# Patient Record
Sex: Female | Born: 1990 | Marital: Single | Smoking: Never smoker
Health system: Southern US, Community
[De-identification: ages and names within clinical notes are randomized; demographics above are authoritative.]

## PROBLEM LIST (undated history)

## (undated) DIAGNOSIS — E039 Hypothyroidism, unspecified: Secondary | ICD-10-CM

## (undated) DIAGNOSIS — T783XXA Angioneurotic edema, initial encounter: Secondary | ICD-10-CM

## (undated) HISTORY — DX: Angioneurotic edema, initial encounter: T78.3XXA

## (undated) HISTORY — PX: WISDOM TOOTH EXTRACTION: SHX21

## (undated) HISTORY — PX: TONSILLECTOMY: SUR1361

## (undated) HISTORY — DX: Hypothyroidism, unspecified: E03.9

## (undated) HISTORY — PX: APPENDECTOMY: SHX54

---

## 2015-01-30 DIAGNOSIS — J309 Allergic rhinitis, unspecified: Secondary | ICD-10-CM

## 2015-01-30 DIAGNOSIS — J3089 Other allergic rhinitis: Secondary | ICD-10-CM | POA: Insufficient documentation

## 2015-01-30 DIAGNOSIS — H1010 Acute atopic conjunctivitis, unspecified eye: Secondary | ICD-10-CM

## 2015-03-14 ENCOUNTER — Ambulatory Visit (INDEPENDENT_AMBULATORY_CARE_PROVIDER_SITE_OTHER): Payer: BLUE CROSS/BLUE SHIELD | Admitting: *Deleted

## 2015-03-14 DIAGNOSIS — J309 Allergic rhinitis, unspecified: Secondary | ICD-10-CM | POA: Diagnosis not present

## 2015-03-23 ENCOUNTER — Ambulatory Visit (INDEPENDENT_AMBULATORY_CARE_PROVIDER_SITE_OTHER): Payer: BLUE CROSS/BLUE SHIELD

## 2015-03-23 DIAGNOSIS — J309 Allergic rhinitis, unspecified: Secondary | ICD-10-CM

## 2015-03-28 ENCOUNTER — Ambulatory Visit (INDEPENDENT_AMBULATORY_CARE_PROVIDER_SITE_OTHER): Payer: BLUE CROSS/BLUE SHIELD

## 2015-03-28 DIAGNOSIS — J309 Allergic rhinitis, unspecified: Secondary | ICD-10-CM | POA: Diagnosis not present

## 2015-04-04 ENCOUNTER — Ambulatory Visit (INDEPENDENT_AMBULATORY_CARE_PROVIDER_SITE_OTHER): Payer: BLUE CROSS/BLUE SHIELD

## 2015-04-04 DIAGNOSIS — J309 Allergic rhinitis, unspecified: Secondary | ICD-10-CM | POA: Diagnosis not present

## 2015-04-11 ENCOUNTER — Ambulatory Visit (INDEPENDENT_AMBULATORY_CARE_PROVIDER_SITE_OTHER): Payer: BLUE CROSS/BLUE SHIELD | Admitting: *Deleted

## 2015-04-11 DIAGNOSIS — J309 Allergic rhinitis, unspecified: Secondary | ICD-10-CM | POA: Diagnosis not present

## 2015-04-20 ENCOUNTER — Ambulatory Visit (INDEPENDENT_AMBULATORY_CARE_PROVIDER_SITE_OTHER): Payer: BLUE CROSS/BLUE SHIELD

## 2015-04-20 DIAGNOSIS — J309 Allergic rhinitis, unspecified: Secondary | ICD-10-CM | POA: Diagnosis not present

## 2015-04-25 ENCOUNTER — Ambulatory Visit (INDEPENDENT_AMBULATORY_CARE_PROVIDER_SITE_OTHER): Payer: BLUE CROSS/BLUE SHIELD | Admitting: *Deleted

## 2015-04-25 DIAGNOSIS — J309 Allergic rhinitis, unspecified: Secondary | ICD-10-CM | POA: Diagnosis not present

## 2015-05-02 ENCOUNTER — Ambulatory Visit (INDEPENDENT_AMBULATORY_CARE_PROVIDER_SITE_OTHER): Payer: BLUE CROSS/BLUE SHIELD | Admitting: *Deleted

## 2015-05-02 DIAGNOSIS — J309 Allergic rhinitis, unspecified: Secondary | ICD-10-CM | POA: Diagnosis not present

## 2015-05-09 ENCOUNTER — Ambulatory Visit (INDEPENDENT_AMBULATORY_CARE_PROVIDER_SITE_OTHER): Payer: BLUE CROSS/BLUE SHIELD

## 2015-05-09 DIAGNOSIS — J309 Allergic rhinitis, unspecified: Secondary | ICD-10-CM

## 2015-05-16 ENCOUNTER — Ambulatory Visit (INDEPENDENT_AMBULATORY_CARE_PROVIDER_SITE_OTHER): Payer: BLUE CROSS/BLUE SHIELD

## 2015-05-16 DIAGNOSIS — J309 Allergic rhinitis, unspecified: Secondary | ICD-10-CM | POA: Diagnosis not present

## 2015-05-23 ENCOUNTER — Ambulatory Visit (INDEPENDENT_AMBULATORY_CARE_PROVIDER_SITE_OTHER): Payer: BLUE CROSS/BLUE SHIELD | Admitting: *Deleted

## 2015-05-23 DIAGNOSIS — J309 Allergic rhinitis, unspecified: Secondary | ICD-10-CM

## 2015-05-30 ENCOUNTER — Ambulatory Visit (INDEPENDENT_AMBULATORY_CARE_PROVIDER_SITE_OTHER): Payer: BLUE CROSS/BLUE SHIELD

## 2015-05-30 DIAGNOSIS — J309 Allergic rhinitis, unspecified: Secondary | ICD-10-CM

## 2015-06-08 ENCOUNTER — Ambulatory Visit (INDEPENDENT_AMBULATORY_CARE_PROVIDER_SITE_OTHER): Payer: BLUE CROSS/BLUE SHIELD

## 2015-06-08 DIAGNOSIS — J309 Allergic rhinitis, unspecified: Secondary | ICD-10-CM | POA: Diagnosis not present

## 2015-06-13 ENCOUNTER — Ambulatory Visit (INDEPENDENT_AMBULATORY_CARE_PROVIDER_SITE_OTHER): Payer: BLUE CROSS/BLUE SHIELD

## 2015-06-13 DIAGNOSIS — J309 Allergic rhinitis, unspecified: Secondary | ICD-10-CM | POA: Diagnosis not present

## 2015-06-22 ENCOUNTER — Ambulatory Visit (INDEPENDENT_AMBULATORY_CARE_PROVIDER_SITE_OTHER): Payer: BLUE CROSS/BLUE SHIELD

## 2015-06-22 DIAGNOSIS — J309 Allergic rhinitis, unspecified: Secondary | ICD-10-CM

## 2015-06-29 ENCOUNTER — Ambulatory Visit (INDEPENDENT_AMBULATORY_CARE_PROVIDER_SITE_OTHER): Payer: BLUE CROSS/BLUE SHIELD

## 2015-06-29 DIAGNOSIS — J309 Allergic rhinitis, unspecified: Secondary | ICD-10-CM | POA: Diagnosis not present

## 2015-07-04 ENCOUNTER — Ambulatory Visit (INDEPENDENT_AMBULATORY_CARE_PROVIDER_SITE_OTHER): Payer: BLUE CROSS/BLUE SHIELD | Admitting: *Deleted

## 2015-07-04 DIAGNOSIS — J309 Allergic rhinitis, unspecified: Secondary | ICD-10-CM | POA: Diagnosis not present

## 2015-07-11 ENCOUNTER — Ambulatory Visit (INDEPENDENT_AMBULATORY_CARE_PROVIDER_SITE_OTHER): Payer: BLUE CROSS/BLUE SHIELD

## 2015-07-11 DIAGNOSIS — J309 Allergic rhinitis, unspecified: Secondary | ICD-10-CM

## 2015-07-18 ENCOUNTER — Ambulatory Visit (INDEPENDENT_AMBULATORY_CARE_PROVIDER_SITE_OTHER): Payer: BLUE CROSS/BLUE SHIELD

## 2015-07-18 DIAGNOSIS — J309 Allergic rhinitis, unspecified: Secondary | ICD-10-CM

## 2015-08-01 ENCOUNTER — Ambulatory Visit (INDEPENDENT_AMBULATORY_CARE_PROVIDER_SITE_OTHER): Payer: BLUE CROSS/BLUE SHIELD

## 2015-08-01 DIAGNOSIS — J309 Allergic rhinitis, unspecified: Secondary | ICD-10-CM | POA: Diagnosis not present

## 2015-08-10 ENCOUNTER — Ambulatory Visit (INDEPENDENT_AMBULATORY_CARE_PROVIDER_SITE_OTHER): Payer: BLUE CROSS/BLUE SHIELD

## 2015-08-10 DIAGNOSIS — J309 Allergic rhinitis, unspecified: Secondary | ICD-10-CM

## 2015-08-15 ENCOUNTER — Ambulatory Visit (INDEPENDENT_AMBULATORY_CARE_PROVIDER_SITE_OTHER): Payer: BLUE CROSS/BLUE SHIELD

## 2015-08-15 DIAGNOSIS — J309 Allergic rhinitis, unspecified: Secondary | ICD-10-CM

## 2015-08-24 ENCOUNTER — Ambulatory Visit (INDEPENDENT_AMBULATORY_CARE_PROVIDER_SITE_OTHER): Payer: BLUE CROSS/BLUE SHIELD

## 2015-08-24 DIAGNOSIS — J309 Allergic rhinitis, unspecified: Secondary | ICD-10-CM | POA: Diagnosis not present

## 2015-08-29 ENCOUNTER — Ambulatory Visit (INDEPENDENT_AMBULATORY_CARE_PROVIDER_SITE_OTHER): Payer: BLUE CROSS/BLUE SHIELD

## 2015-08-29 DIAGNOSIS — J309 Allergic rhinitis, unspecified: Secondary | ICD-10-CM

## 2015-09-05 ENCOUNTER — Ambulatory Visit (INDEPENDENT_AMBULATORY_CARE_PROVIDER_SITE_OTHER): Payer: BLUE CROSS/BLUE SHIELD

## 2015-09-05 DIAGNOSIS — J309 Allergic rhinitis, unspecified: Secondary | ICD-10-CM | POA: Diagnosis not present

## 2015-09-08 DIAGNOSIS — J3089 Other allergic rhinitis: Secondary | ICD-10-CM | POA: Diagnosis not present

## 2015-09-12 ENCOUNTER — Ambulatory Visit (INDEPENDENT_AMBULATORY_CARE_PROVIDER_SITE_OTHER): Payer: BLUE CROSS/BLUE SHIELD

## 2015-09-12 DIAGNOSIS — J309 Allergic rhinitis, unspecified: Secondary | ICD-10-CM | POA: Diagnosis not present

## 2015-09-21 ENCOUNTER — Ambulatory Visit (INDEPENDENT_AMBULATORY_CARE_PROVIDER_SITE_OTHER): Payer: BLUE CROSS/BLUE SHIELD

## 2015-09-21 DIAGNOSIS — J309 Allergic rhinitis, unspecified: Secondary | ICD-10-CM

## 2015-09-26 ENCOUNTER — Ambulatory Visit (INDEPENDENT_AMBULATORY_CARE_PROVIDER_SITE_OTHER): Payer: BLUE CROSS/BLUE SHIELD | Admitting: *Deleted

## 2015-09-26 DIAGNOSIS — J309 Allergic rhinitis, unspecified: Secondary | ICD-10-CM

## 2015-10-03 ENCOUNTER — Ambulatory Visit (INDEPENDENT_AMBULATORY_CARE_PROVIDER_SITE_OTHER): Payer: BLUE CROSS/BLUE SHIELD

## 2015-10-03 DIAGNOSIS — J309 Allergic rhinitis, unspecified: Secondary | ICD-10-CM | POA: Diagnosis not present

## 2015-10-10 ENCOUNTER — Ambulatory Visit (INDEPENDENT_AMBULATORY_CARE_PROVIDER_SITE_OTHER): Payer: BLUE CROSS/BLUE SHIELD

## 2015-10-10 DIAGNOSIS — J309 Allergic rhinitis, unspecified: Secondary | ICD-10-CM | POA: Diagnosis not present

## 2015-10-19 ENCOUNTER — Ambulatory Visit (INDEPENDENT_AMBULATORY_CARE_PROVIDER_SITE_OTHER): Payer: BLUE CROSS/BLUE SHIELD

## 2015-10-19 DIAGNOSIS — J309 Allergic rhinitis, unspecified: Secondary | ICD-10-CM | POA: Diagnosis not present

## 2015-10-26 ENCOUNTER — Ambulatory Visit (INDEPENDENT_AMBULATORY_CARE_PROVIDER_SITE_OTHER): Payer: BLUE CROSS/BLUE SHIELD

## 2015-10-26 DIAGNOSIS — J309 Allergic rhinitis, unspecified: Secondary | ICD-10-CM

## 2015-10-31 ENCOUNTER — Ambulatory Visit (INDEPENDENT_AMBULATORY_CARE_PROVIDER_SITE_OTHER): Payer: BLUE CROSS/BLUE SHIELD | Admitting: *Deleted

## 2015-10-31 DIAGNOSIS — J309 Allergic rhinitis, unspecified: Secondary | ICD-10-CM | POA: Diagnosis not present

## 2015-11-07 ENCOUNTER — Ambulatory Visit (INDEPENDENT_AMBULATORY_CARE_PROVIDER_SITE_OTHER): Payer: BLUE CROSS/BLUE SHIELD

## 2015-11-07 DIAGNOSIS — J309 Allergic rhinitis, unspecified: Secondary | ICD-10-CM | POA: Diagnosis not present

## 2015-11-14 ENCOUNTER — Ambulatory Visit (INDEPENDENT_AMBULATORY_CARE_PROVIDER_SITE_OTHER): Payer: BLUE CROSS/BLUE SHIELD

## 2015-11-14 DIAGNOSIS — J309 Allergic rhinitis, unspecified: Secondary | ICD-10-CM

## 2015-11-23 ENCOUNTER — Ambulatory Visit (INDEPENDENT_AMBULATORY_CARE_PROVIDER_SITE_OTHER): Payer: BLUE CROSS/BLUE SHIELD | Admitting: *Deleted

## 2015-11-23 DIAGNOSIS — J309 Allergic rhinitis, unspecified: Secondary | ICD-10-CM

## 2015-11-30 ENCOUNTER — Ambulatory Visit (INDEPENDENT_AMBULATORY_CARE_PROVIDER_SITE_OTHER): Payer: BLUE CROSS/BLUE SHIELD

## 2015-11-30 DIAGNOSIS — J309 Allergic rhinitis, unspecified: Secondary | ICD-10-CM | POA: Diagnosis not present

## 2015-12-07 ENCOUNTER — Ambulatory Visit (INDEPENDENT_AMBULATORY_CARE_PROVIDER_SITE_OTHER): Payer: BLUE CROSS/BLUE SHIELD

## 2015-12-07 DIAGNOSIS — J309 Allergic rhinitis, unspecified: Secondary | ICD-10-CM | POA: Diagnosis not present

## 2015-12-14 ENCOUNTER — Ambulatory Visit (INDEPENDENT_AMBULATORY_CARE_PROVIDER_SITE_OTHER): Payer: BLUE CROSS/BLUE SHIELD

## 2015-12-14 DIAGNOSIS — J309 Allergic rhinitis, unspecified: Secondary | ICD-10-CM | POA: Diagnosis not present

## 2015-12-21 ENCOUNTER — Ambulatory Visit (INDEPENDENT_AMBULATORY_CARE_PROVIDER_SITE_OTHER): Payer: BLUE CROSS/BLUE SHIELD

## 2015-12-21 DIAGNOSIS — J309 Allergic rhinitis, unspecified: Secondary | ICD-10-CM

## 2015-12-26 ENCOUNTER — Ambulatory Visit (INDEPENDENT_AMBULATORY_CARE_PROVIDER_SITE_OTHER): Payer: BLUE CROSS/BLUE SHIELD | Admitting: *Deleted

## 2015-12-26 DIAGNOSIS — J309 Allergic rhinitis, unspecified: Secondary | ICD-10-CM | POA: Diagnosis not present

## 2016-01-02 ENCOUNTER — Ambulatory Visit (INDEPENDENT_AMBULATORY_CARE_PROVIDER_SITE_OTHER): Payer: BLUE CROSS/BLUE SHIELD | Admitting: *Deleted

## 2016-01-02 DIAGNOSIS — J309 Allergic rhinitis, unspecified: Secondary | ICD-10-CM | POA: Diagnosis not present

## 2016-01-09 ENCOUNTER — Ambulatory Visit (INDEPENDENT_AMBULATORY_CARE_PROVIDER_SITE_OTHER): Payer: BLUE CROSS/BLUE SHIELD

## 2016-01-09 DIAGNOSIS — J309 Allergic rhinitis, unspecified: Secondary | ICD-10-CM

## 2016-01-16 ENCOUNTER — Ambulatory Visit (INDEPENDENT_AMBULATORY_CARE_PROVIDER_SITE_OTHER): Payer: BLUE CROSS/BLUE SHIELD | Admitting: *Deleted

## 2016-01-16 DIAGNOSIS — J309 Allergic rhinitis, unspecified: Secondary | ICD-10-CM

## 2016-01-25 ENCOUNTER — Ambulatory Visit (INDEPENDENT_AMBULATORY_CARE_PROVIDER_SITE_OTHER): Payer: BLUE CROSS/BLUE SHIELD

## 2016-01-25 DIAGNOSIS — J309 Allergic rhinitis, unspecified: Secondary | ICD-10-CM

## 2016-01-30 ENCOUNTER — Ambulatory Visit (INDEPENDENT_AMBULATORY_CARE_PROVIDER_SITE_OTHER): Payer: BLUE CROSS/BLUE SHIELD | Admitting: *Deleted

## 2016-01-30 DIAGNOSIS — J309 Allergic rhinitis, unspecified: Secondary | ICD-10-CM

## 2016-01-31 DIAGNOSIS — J3089 Other allergic rhinitis: Secondary | ICD-10-CM | POA: Diagnosis not present

## 2016-02-08 ENCOUNTER — Ambulatory Visit (INDEPENDENT_AMBULATORY_CARE_PROVIDER_SITE_OTHER): Payer: BLUE CROSS/BLUE SHIELD | Admitting: *Deleted

## 2016-02-08 DIAGNOSIS — J309 Allergic rhinitis, unspecified: Secondary | ICD-10-CM

## 2016-02-13 ENCOUNTER — Ambulatory Visit (INDEPENDENT_AMBULATORY_CARE_PROVIDER_SITE_OTHER): Payer: BLUE CROSS/BLUE SHIELD

## 2016-02-13 DIAGNOSIS — J309 Allergic rhinitis, unspecified: Secondary | ICD-10-CM | POA: Diagnosis not present

## 2016-02-22 ENCOUNTER — Ambulatory Visit (INDEPENDENT_AMBULATORY_CARE_PROVIDER_SITE_OTHER): Payer: BLUE CROSS/BLUE SHIELD | Admitting: *Deleted

## 2016-02-22 DIAGNOSIS — J309 Allergic rhinitis, unspecified: Secondary | ICD-10-CM

## 2016-02-27 ENCOUNTER — Ambulatory Visit (INDEPENDENT_AMBULATORY_CARE_PROVIDER_SITE_OTHER): Payer: BLUE CROSS/BLUE SHIELD

## 2016-02-27 DIAGNOSIS — J309 Allergic rhinitis, unspecified: Secondary | ICD-10-CM

## 2016-03-05 ENCOUNTER — Ambulatory Visit (INDEPENDENT_AMBULATORY_CARE_PROVIDER_SITE_OTHER): Payer: BLUE CROSS/BLUE SHIELD

## 2016-03-05 DIAGNOSIS — J309 Allergic rhinitis, unspecified: Secondary | ICD-10-CM

## 2016-03-14 ENCOUNTER — Ambulatory Visit (INDEPENDENT_AMBULATORY_CARE_PROVIDER_SITE_OTHER): Payer: BLUE CROSS/BLUE SHIELD | Admitting: *Deleted

## 2016-03-14 DIAGNOSIS — J309 Allergic rhinitis, unspecified: Secondary | ICD-10-CM | POA: Diagnosis not present

## 2016-03-21 ENCOUNTER — Ambulatory Visit (INDEPENDENT_AMBULATORY_CARE_PROVIDER_SITE_OTHER): Payer: BLUE CROSS/BLUE SHIELD | Admitting: *Deleted

## 2016-03-21 DIAGNOSIS — J309 Allergic rhinitis, unspecified: Secondary | ICD-10-CM

## 2016-03-26 ENCOUNTER — Ambulatory Visit (INDEPENDENT_AMBULATORY_CARE_PROVIDER_SITE_OTHER): Payer: BLUE CROSS/BLUE SHIELD | Admitting: *Deleted

## 2016-03-26 DIAGNOSIS — J309 Allergic rhinitis, unspecified: Secondary | ICD-10-CM | POA: Diagnosis not present

## 2016-04-09 ENCOUNTER — Ambulatory Visit (INDEPENDENT_AMBULATORY_CARE_PROVIDER_SITE_OTHER): Payer: BLUE CROSS/BLUE SHIELD

## 2016-04-09 DIAGNOSIS — J309 Allergic rhinitis, unspecified: Secondary | ICD-10-CM | POA: Diagnosis not present

## 2016-04-16 ENCOUNTER — Ambulatory Visit (INDEPENDENT_AMBULATORY_CARE_PROVIDER_SITE_OTHER): Payer: BLUE CROSS/BLUE SHIELD

## 2016-04-16 DIAGNOSIS — J309 Allergic rhinitis, unspecified: Secondary | ICD-10-CM | POA: Diagnosis not present

## 2016-04-25 ENCOUNTER — Ambulatory Visit (INDEPENDENT_AMBULATORY_CARE_PROVIDER_SITE_OTHER): Payer: BLUE CROSS/BLUE SHIELD | Admitting: *Deleted

## 2016-04-25 DIAGNOSIS — J309 Allergic rhinitis, unspecified: Secondary | ICD-10-CM | POA: Diagnosis not present

## 2016-04-30 ENCOUNTER — Ambulatory Visit (INDEPENDENT_AMBULATORY_CARE_PROVIDER_SITE_OTHER): Payer: BLUE CROSS/BLUE SHIELD | Admitting: *Deleted

## 2016-04-30 DIAGNOSIS — J309 Allergic rhinitis, unspecified: Secondary | ICD-10-CM | POA: Diagnosis not present

## 2016-05-08 DIAGNOSIS — J3089 Other allergic rhinitis: Secondary | ICD-10-CM | POA: Diagnosis not present

## 2016-05-09 ENCOUNTER — Ambulatory Visit (INDEPENDENT_AMBULATORY_CARE_PROVIDER_SITE_OTHER): Payer: BLUE CROSS/BLUE SHIELD

## 2016-05-09 DIAGNOSIS — J309 Allergic rhinitis, unspecified: Secondary | ICD-10-CM

## 2016-05-16 ENCOUNTER — Ambulatory Visit (INDEPENDENT_AMBULATORY_CARE_PROVIDER_SITE_OTHER): Payer: BLUE CROSS/BLUE SHIELD | Admitting: *Deleted

## 2016-05-16 DIAGNOSIS — J309 Allergic rhinitis, unspecified: Secondary | ICD-10-CM | POA: Diagnosis not present

## 2016-05-21 ENCOUNTER — Ambulatory Visit (INDEPENDENT_AMBULATORY_CARE_PROVIDER_SITE_OTHER): Payer: BLUE CROSS/BLUE SHIELD | Admitting: *Deleted

## 2016-05-21 DIAGNOSIS — J309 Allergic rhinitis, unspecified: Secondary | ICD-10-CM | POA: Diagnosis not present

## 2016-05-28 ENCOUNTER — Ambulatory Visit (INDEPENDENT_AMBULATORY_CARE_PROVIDER_SITE_OTHER): Payer: BLUE CROSS/BLUE SHIELD | Admitting: *Deleted

## 2016-05-28 DIAGNOSIS — J309 Allergic rhinitis, unspecified: Secondary | ICD-10-CM | POA: Diagnosis not present

## 2016-06-06 ENCOUNTER — Ambulatory Visit (INDEPENDENT_AMBULATORY_CARE_PROVIDER_SITE_OTHER): Payer: BLUE CROSS/BLUE SHIELD

## 2016-06-06 DIAGNOSIS — J309 Allergic rhinitis, unspecified: Secondary | ICD-10-CM | POA: Diagnosis not present

## 2016-06-20 ENCOUNTER — Ambulatory Visit (INDEPENDENT_AMBULATORY_CARE_PROVIDER_SITE_OTHER): Payer: BLUE CROSS/BLUE SHIELD

## 2016-06-20 DIAGNOSIS — J309 Allergic rhinitis, unspecified: Secondary | ICD-10-CM

## 2016-06-25 ENCOUNTER — Ambulatory Visit (INDEPENDENT_AMBULATORY_CARE_PROVIDER_SITE_OTHER): Payer: BLUE CROSS/BLUE SHIELD | Admitting: *Deleted

## 2016-06-25 DIAGNOSIS — J309 Allergic rhinitis, unspecified: Secondary | ICD-10-CM

## 2016-07-04 ENCOUNTER — Ambulatory Visit (INDEPENDENT_AMBULATORY_CARE_PROVIDER_SITE_OTHER): Payer: BLUE CROSS/BLUE SHIELD | Admitting: *Deleted

## 2016-07-04 DIAGNOSIS — J309 Allergic rhinitis, unspecified: Secondary | ICD-10-CM | POA: Diagnosis not present

## 2016-07-09 ENCOUNTER — Ambulatory Visit (INDEPENDENT_AMBULATORY_CARE_PROVIDER_SITE_OTHER): Payer: BLUE CROSS/BLUE SHIELD

## 2016-07-09 DIAGNOSIS — J309 Allergic rhinitis, unspecified: Secondary | ICD-10-CM

## 2016-07-16 ENCOUNTER — Ambulatory Visit (INDEPENDENT_AMBULATORY_CARE_PROVIDER_SITE_OTHER): Payer: BLUE CROSS/BLUE SHIELD

## 2016-07-16 DIAGNOSIS — J309 Allergic rhinitis, unspecified: Secondary | ICD-10-CM | POA: Diagnosis not present

## 2016-07-19 NOTE — Addendum Note (Signed)
Addended by: Berna BueWHITAKER, CARRIE L on: 07/19/2016 11:52 AM   Modules accepted: Orders

## 2016-07-30 ENCOUNTER — Ambulatory Visit (INDEPENDENT_AMBULATORY_CARE_PROVIDER_SITE_OTHER): Payer: BLUE CROSS/BLUE SHIELD | Admitting: *Deleted

## 2016-07-30 DIAGNOSIS — J309 Allergic rhinitis, unspecified: Secondary | ICD-10-CM

## 2016-08-08 ENCOUNTER — Ambulatory Visit (INDEPENDENT_AMBULATORY_CARE_PROVIDER_SITE_OTHER): Payer: BLUE CROSS/BLUE SHIELD

## 2016-08-08 DIAGNOSIS — J309 Allergic rhinitis, unspecified: Secondary | ICD-10-CM | POA: Diagnosis not present

## 2016-08-13 DIAGNOSIS — J3089 Other allergic rhinitis: Secondary | ICD-10-CM | POA: Diagnosis not present

## 2016-08-15 ENCOUNTER — Encounter (INDEPENDENT_AMBULATORY_CARE_PROVIDER_SITE_OTHER): Payer: Self-pay

## 2016-08-15 ENCOUNTER — Encounter: Payer: Self-pay | Admitting: Allergy & Immunology

## 2016-08-15 ENCOUNTER — Ambulatory Visit: Payer: Self-pay | Admitting: *Deleted

## 2016-08-15 ENCOUNTER — Ambulatory Visit (INDEPENDENT_AMBULATORY_CARE_PROVIDER_SITE_OTHER): Payer: BLUE CROSS/BLUE SHIELD | Admitting: Allergy & Immunology

## 2016-08-15 VITALS — BP 102/72 | HR 78 | Temp 98.0°F | Resp 14 | Ht 61.0 in | Wt 117.0 lb

## 2016-08-15 DIAGNOSIS — J3089 Other allergic rhinitis: Secondary | ICD-10-CM

## 2016-08-15 DIAGNOSIS — J309 Allergic rhinitis, unspecified: Secondary | ICD-10-CM

## 2016-08-15 MED ORDER — EPINEPHRINE 0.3 MG/0.3ML IJ SOAJ: 0.3000 mg | Freq: Once | INTRAMUSCULAR | 1 refills | Status: AC

## 2016-08-15 NOTE — Progress Notes (Signed)
FOLLOW UP  Date of Service/Encounter:  08/15/16   Assessment:   Chronic nonseasonal allergic rhinitis due to other allergen   Plan/Recommendations:   1. Chronic nonseasonal allergic rhinitis - on immunotherapy - We will continue with the injections at the same rate.  - We will advance your dose very slowly given your reaction: drop down to 0.8725mL Stacy remain there for three doses. - Then if tolerated, advance at the Schedule A rate. - Try taking an extra Zyrtec (cetirizine) on the day that you have shots.  - We will send in a prescription for AuviQ.  - Continue with Zyrtec (cetirizine) 10mg  daily. - Look on [employer] for cheaper generics.   2. Return in about 1 year (around 08/15/2017).     Subjective:   Stacy Howard is a 26 y.o. female presenting today for follow up of  Chief Complaint  Patient presents with  . Allergic Rhinitis     needs epipen refilled. medications are working well. did have strange reaction 2 weeks ago, 1 hr after injection. Had a scratchy feeling in throat, then felt like it was harder to breath, Stacy felt like throat was closing.     Stacy Howard has a history of the following: Patient Active Problem List   Diagnosis Date Noted  . Allergic rhinitis 01/30/2015  . Allergic conjunctivitis 01/30/2015    History obtained from: chart review Stacy patient.  Madyn Capistran was referred by Molinda BailiffSMITH, ERNEST, PA.   Stacy Howard is a 26 y.o. female presenting for a follow up visit. She is currently on immunotherapy Stacy receives only one injection with dust mite. She was seen in 2016 for an office visit. She was previously allergic to cats Stacy grass but this was not present on the last testing. She has been on injections for a period of time when she was around [age]. Then she started a few years again.  Since the last visit, she has done well. She feels that her shots are working well. She had a reaction within one hour when she was on her way home. The reaction  did not last long, but the throat issue is what worried her the most. Then she felt rhinorrhea Stacy itchy eyes. These are much better with the injections. She takes cetirizine daily.   Otherwise, there have been no changes to her past medical history, surgical history, family history, or social history. She is otherwise healthy. 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    Review of Systems: a 14-point review of systems is pertinent for what is mentioned in HPI.  Otherwise, all other systems were negative. Constitutional: negative other than that listed in the HPI Eyes: negative other than that listed in the HPI Ears, nose, mouth, throat, Stacy face: negative other than that listed in the HPI Respiratory: negative other than that listed in the HPI Cardiovascular: negative other than that listed in the HPI Gastrointestinal: negative other than that listed in the HPI Genitourinary: negative other than that listed in the HPI Integument: negative other than that listed in the HPI Hematologic: negative other than that listed in the HPI Musculoskeletal: negative other than that listed in the HPI Neurological: negative other than that listed in the HPI Allergy/Immunologic: negative other than that listed in the HPI    Objective:   Blood pressure 102/72, pulse 78, temperature 98 F (36.7 C), temperature source Oral, resp. rate 14, height 5\' 1"  (  1.549 m), weight 117 lb (53.1 kg), SpO2 99 %. Body mass index is 22.11 kg/m.   Physical Exam:  General: Alert, interactive, in no acute distress. Cooperative with the exam. Pleasant.  Eyes: No conjunctival injection present on the right, No conjunctival injection present on the left, PERRL bilaterally, No discharge on the right, No discharge on the left Stacy No Horner-Trantas dots present Ears: Right TM pearly gray with normal light reflex, Left TM pearly gray with normal light reflex,  Right TM intact without perforation Stacy Left TM intact without perforation.  Nose/Throat: External nose within normal limits Stacy septum midline, turbinates edematous with clear discharge, post-pharynx erythematous without cobblestoning in the posterior oropharynx. Tonsils 2+ without exudates Neck: Supple without thyromegaly. Lungs: Clear to auscultation without wheezing, rhonchi or rales. No increased work of breathing. CV: Normal S1/S2, no murmurs. Capillary refill <2 seconds.  Skin: Warm Stacy dry, without lesions or rashes. Neuro:   Grossly intact. No focal deficits appreciated. Responsive to questions.   Diagnostic studies: None    Stacy Bonds, MD [facility]

## 2016-08-15 NOTE — Patient Instructions (Addendum)
1. Chronic nonseasonal allergic rhinitis - on immunotherapy - We will continue with the injections at the same rate.  - We will advance your dose very slowly given your reaction: drop down to 0.4625mL and remain there for three doses. - Then if tolerated, advance at the Schedule A rate. - Try taking an extra Zyrtec (cetirizine) on the day that you have shots.  - We will send in a prescription for AuviQ.  - Continue with Zyrtec (cetirizine) 10mg  daily. - Look on [employer] for cheaper generics.   2. Return in about 1 year (around 08/15/2017).  Please inform us of any Emergency Department visits, hospitalizations, or changes in symptoms. Call us before going to the ED for breathing or allergy symptoms since we might be able to fit you in for a sick visit. Feel free to contact us anytime with any questions, problems, or concerns.  It was a pleasure to meet you today! Happy spring!   Websites that have reliable patient information: 1. American Academy of Asthma, Allergy, and Immunology: www.aaaai.org 2. Food Allergy Research and Education (FARE): foodallergy.org 3. Mothers of Asthmatics: http://www.asthmacommunitynetwork.org 4. American College of Allergy, Asthma, and Immunology: www.acaai.org

## 2016-08-22 ENCOUNTER — Ambulatory Visit (INDEPENDENT_AMBULATORY_CARE_PROVIDER_SITE_OTHER): Payer: BLUE CROSS/BLUE SHIELD | Admitting: *Deleted

## 2016-08-22 DIAGNOSIS — J309 Allergic rhinitis, unspecified: Secondary | ICD-10-CM | POA: Diagnosis not present

## 2016-08-29 ENCOUNTER — Ambulatory Visit (INDEPENDENT_AMBULATORY_CARE_PROVIDER_SITE_OTHER): Payer: BLUE CROSS/BLUE SHIELD | Admitting: *Deleted

## 2016-08-29 DIAGNOSIS — J309 Allergic rhinitis, unspecified: Secondary | ICD-10-CM | POA: Diagnosis not present

## 2016-09-03 ENCOUNTER — Ambulatory Visit (INDEPENDENT_AMBULATORY_CARE_PROVIDER_SITE_OTHER): Payer: BLUE CROSS/BLUE SHIELD | Admitting: *Deleted

## 2016-09-03 DIAGNOSIS — J309 Allergic rhinitis, unspecified: Secondary | ICD-10-CM | POA: Diagnosis not present

## 2016-09-12 ENCOUNTER — Ambulatory Visit (INDEPENDENT_AMBULATORY_CARE_PROVIDER_SITE_OTHER): Payer: BLUE CROSS/BLUE SHIELD | Admitting: *Deleted

## 2016-09-12 DIAGNOSIS — J309 Allergic rhinitis, unspecified: Secondary | ICD-10-CM

## 2016-09-19 ENCOUNTER — Ambulatory Visit (INDEPENDENT_AMBULATORY_CARE_PROVIDER_SITE_OTHER): Payer: BLUE CROSS/BLUE SHIELD | Admitting: *Deleted

## 2016-09-19 DIAGNOSIS — J309 Allergic rhinitis, unspecified: Secondary | ICD-10-CM

## 2016-09-24 ENCOUNTER — Ambulatory Visit (INDEPENDENT_AMBULATORY_CARE_PROVIDER_SITE_OTHER): Payer: BLUE CROSS/BLUE SHIELD | Admitting: *Deleted

## 2016-09-24 DIAGNOSIS — J309 Allergic rhinitis, unspecified: Secondary | ICD-10-CM | POA: Diagnosis not present

## 2016-10-03 ENCOUNTER — Ambulatory Visit (INDEPENDENT_AMBULATORY_CARE_PROVIDER_SITE_OTHER): Payer: BLUE CROSS/BLUE SHIELD | Admitting: *Deleted

## 2016-10-03 DIAGNOSIS — J309 Allergic rhinitis, unspecified: Secondary | ICD-10-CM | POA: Diagnosis not present

## 2016-10-08 ENCOUNTER — Ambulatory Visit (INDEPENDENT_AMBULATORY_CARE_PROVIDER_SITE_OTHER): Payer: BLUE CROSS/BLUE SHIELD | Admitting: *Deleted

## 2016-10-08 DIAGNOSIS — J309 Allergic rhinitis, unspecified: Secondary | ICD-10-CM | POA: Diagnosis not present

## 2016-10-17 ENCOUNTER — Ambulatory Visit (INDEPENDENT_AMBULATORY_CARE_PROVIDER_SITE_OTHER): Payer: BLUE CROSS/BLUE SHIELD | Admitting: *Deleted

## 2016-10-17 DIAGNOSIS — J309 Allergic rhinitis, unspecified: Secondary | ICD-10-CM

## 2016-10-22 ENCOUNTER — Ambulatory Visit (INDEPENDENT_AMBULATORY_CARE_PROVIDER_SITE_OTHER): Payer: BLUE CROSS/BLUE SHIELD | Admitting: *Deleted

## 2016-10-22 DIAGNOSIS — J309 Allergic rhinitis, unspecified: Secondary | ICD-10-CM

## 2016-10-29 ENCOUNTER — Ambulatory Visit (INDEPENDENT_AMBULATORY_CARE_PROVIDER_SITE_OTHER): Payer: BLUE CROSS/BLUE SHIELD | Admitting: *Deleted

## 2016-10-29 DIAGNOSIS — J309 Allergic rhinitis, unspecified: Secondary | ICD-10-CM | POA: Diagnosis not present

## 2016-11-12 ENCOUNTER — Ambulatory Visit (INDEPENDENT_AMBULATORY_CARE_PROVIDER_SITE_OTHER): Payer: BLUE CROSS/BLUE SHIELD | Admitting: *Deleted

## 2016-11-12 DIAGNOSIS — J309 Allergic rhinitis, unspecified: Secondary | ICD-10-CM | POA: Diagnosis not present

## 2016-11-19 ENCOUNTER — Encounter: Payer: Self-pay | Admitting: *Deleted

## 2016-11-19 ENCOUNTER — Telehealth: Payer: Self-pay | Admitting: *Deleted

## 2016-11-19 NOTE — Telephone Encounter (Signed)
Patient came in to get allergy injection today but states that last week 1 hour after receiving injection she felt like her throat was closing but did not use epipen she did take antihistamine. Advised patient would have to consult with the doctor to adjust dose then we would call her. Dr Dellis AnesGallagher please advise

## 2016-11-19 NOTE — Telephone Encounter (Signed)
Reviewed dosing. Please decrease to 0.525mL for the next visit and the following visit. Then increase 0.2405mL at every other injection visit (i.e. 0.6625mL x 2 doses, 0.230mL x 2 doses, 0.6435mL x 2 doses, etc).   **This is Ms. Fraleigh's second anaphylaxis from immunotherapy (last one in March 2018). If she has a third reaction, we will have to strongly consider taking her off of allergy shots. There is a dust mite oral immunotherapy on the market, so we could try to get that approved if needed.  Malachi BondsJoel Cyree Chuong, MD FAAAAI [facility] [place]

## 2016-11-19 NOTE — Progress Notes (Signed)
This encounter was created in error - please disregard.

## 2016-11-20 NOTE — Telephone Encounter (Signed)
FYI

## 2016-11-20 NOTE — Progress Notes (Signed)
Vials to be made 11-22-16  jm 

## 2016-11-20 NOTE — Telephone Encounter (Signed)
Spoke with patient advised as written below verbalized understanding.

## 2016-11-21 ENCOUNTER — Ambulatory Visit (INDEPENDENT_AMBULATORY_CARE_PROVIDER_SITE_OTHER): Payer: BLUE CROSS/BLUE SHIELD | Admitting: *Deleted

## 2016-11-21 DIAGNOSIS — J309 Allergic rhinitis, unspecified: Secondary | ICD-10-CM

## 2016-11-22 DIAGNOSIS — J3089 Other allergic rhinitis: Secondary | ICD-10-CM | POA: Diagnosis not present

## 2016-11-28 ENCOUNTER — Ambulatory Visit (INDEPENDENT_AMBULATORY_CARE_PROVIDER_SITE_OTHER): Payer: BLUE CROSS/BLUE SHIELD

## 2016-11-28 DIAGNOSIS — J309 Allergic rhinitis, unspecified: Secondary | ICD-10-CM | POA: Diagnosis not present

## 2016-12-05 ENCOUNTER — Ambulatory Visit (INDEPENDENT_AMBULATORY_CARE_PROVIDER_SITE_OTHER): Payer: BLUE CROSS/BLUE SHIELD | Admitting: *Deleted

## 2016-12-05 DIAGNOSIS — J309 Allergic rhinitis, unspecified: Secondary | ICD-10-CM | POA: Diagnosis not present

## 2016-12-10 ENCOUNTER — Ambulatory Visit (INDEPENDENT_AMBULATORY_CARE_PROVIDER_SITE_OTHER): Payer: BLUE CROSS/BLUE SHIELD

## 2016-12-10 DIAGNOSIS — J309 Allergic rhinitis, unspecified: Secondary | ICD-10-CM | POA: Diagnosis not present

## 2016-12-19 ENCOUNTER — Ambulatory Visit (INDEPENDENT_AMBULATORY_CARE_PROVIDER_SITE_OTHER): Payer: BLUE CROSS/BLUE SHIELD | Admitting: *Deleted

## 2016-12-19 DIAGNOSIS — J309 Allergic rhinitis, unspecified: Secondary | ICD-10-CM | POA: Diagnosis not present

## 2016-12-31 ENCOUNTER — Ambulatory Visit (INDEPENDENT_AMBULATORY_CARE_PROVIDER_SITE_OTHER): Payer: BLUE CROSS/BLUE SHIELD | Admitting: *Deleted

## 2016-12-31 DIAGNOSIS — J309 Allergic rhinitis, unspecified: Secondary | ICD-10-CM

## 2017-01-09 ENCOUNTER — Ambulatory Visit (INDEPENDENT_AMBULATORY_CARE_PROVIDER_SITE_OTHER): Payer: BLUE CROSS/BLUE SHIELD | Admitting: *Deleted

## 2017-01-09 DIAGNOSIS — J309 Allergic rhinitis, unspecified: Secondary | ICD-10-CM | POA: Diagnosis not present

## 2017-01-16 ENCOUNTER — Ambulatory Visit (INDEPENDENT_AMBULATORY_CARE_PROVIDER_SITE_OTHER): Payer: BLUE CROSS/BLUE SHIELD | Admitting: *Deleted

## 2017-01-16 DIAGNOSIS — J309 Allergic rhinitis, unspecified: Secondary | ICD-10-CM | POA: Diagnosis not present

## 2017-01-21 ENCOUNTER — Ambulatory Visit (INDEPENDENT_AMBULATORY_CARE_PROVIDER_SITE_OTHER): Payer: BLUE CROSS/BLUE SHIELD

## 2017-01-21 DIAGNOSIS — J309 Allergic rhinitis, unspecified: Secondary | ICD-10-CM | POA: Diagnosis not present

## 2017-01-30 ENCOUNTER — Ambulatory Visit (INDEPENDENT_AMBULATORY_CARE_PROVIDER_SITE_OTHER): Payer: BLUE CROSS/BLUE SHIELD | Admitting: *Deleted

## 2017-01-30 DIAGNOSIS — J309 Allergic rhinitis, unspecified: Secondary | ICD-10-CM | POA: Diagnosis not present

## 2017-02-04 ENCOUNTER — Ambulatory Visit (INDEPENDENT_AMBULATORY_CARE_PROVIDER_SITE_OTHER): Payer: BLUE CROSS/BLUE SHIELD | Admitting: *Deleted

## 2017-02-04 DIAGNOSIS — J309 Allergic rhinitis, unspecified: Secondary | ICD-10-CM

## 2017-02-18 ENCOUNTER — Ambulatory Visit (INDEPENDENT_AMBULATORY_CARE_PROVIDER_SITE_OTHER): Payer: BLUE CROSS/BLUE SHIELD | Admitting: *Deleted

## 2017-02-18 DIAGNOSIS — J309 Allergic rhinitis, unspecified: Secondary | ICD-10-CM | POA: Diagnosis not present

## 2017-02-25 ENCOUNTER — Telehealth: Payer: Self-pay | Admitting: *Deleted

## 2017-02-25 ENCOUNTER — Encounter: Payer: Self-pay | Admitting: *Deleted

## 2017-02-25 NOTE — Telephone Encounter (Signed)
Reviewed note and vial script. Please decrease to 0.51mL and restart advance from there.  Thanks, Malachi Bonds, MD [facility]

## 2017-02-25 NOTE — Progress Notes (Signed)
This encounter was created in error - please disregard.

## 2017-02-25 NOTE — Telephone Encounter (Signed)
Patient came in today to get allergy injection patient stated that she had itchy scratchy troat last week after having injection Red 1:100 @ 0.35 states she had hard time taking a deep breathe and felt like she was wheezing. Advised patient we could not give her itx injection today. Will send message to Dr Dellis Anes. Please advise.

## 2017-02-26 NOTE — Telephone Encounter (Signed)
Please review telephone note from 11/19/16.  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  Please advise if you want to do repeat doses and gradual build up this time or something else.

## 2017-02-26 NOTE — Telephone Encounter (Signed)
Thanks for the heads up. So this her third anaphylaxis episode. Let's back down to 0.44mL of the Red Vial and hold her here as her full maintenance dose from here on out.  Thanks, Malachi Bonds, MD [facility]

## 2017-02-27 ENCOUNTER — Ambulatory Visit (INDEPENDENT_AMBULATORY_CARE_PROVIDER_SITE_OTHER): Payer: BLUE CROSS/BLUE SHIELD | Admitting: *Deleted

## 2017-02-27 DIAGNOSIS — J309 Allergic rhinitis, unspecified: Secondary | ICD-10-CM | POA: Diagnosis not present

## 2017-02-27 NOTE — Telephone Encounter (Signed)
Patient advised as written per Dr Lucie Leather she verbalized understanding.

## 2017-02-27 NOTE — Telephone Encounter (Signed)
Left message advising to return call

## 2017-03-06 ENCOUNTER — Ambulatory Visit (INDEPENDENT_AMBULATORY_CARE_PROVIDER_SITE_OTHER): Payer: BLUE CROSS/BLUE SHIELD | Admitting: *Deleted

## 2017-03-06 DIAGNOSIS — J309 Allergic rhinitis, unspecified: Secondary | ICD-10-CM

## 2017-03-11 ENCOUNTER — Ambulatory Visit (INDEPENDENT_AMBULATORY_CARE_PROVIDER_SITE_OTHER): Payer: BLUE CROSS/BLUE SHIELD | Admitting: *Deleted

## 2017-03-11 DIAGNOSIS — J309 Allergic rhinitis, unspecified: Secondary | ICD-10-CM | POA: Diagnosis not present

## 2017-03-25 ENCOUNTER — Ambulatory Visit (INDEPENDENT_AMBULATORY_CARE_PROVIDER_SITE_OTHER): Payer: BLUE CROSS/BLUE SHIELD | Admitting: *Deleted

## 2017-03-25 DIAGNOSIS — J309 Allergic rhinitis, unspecified: Secondary | ICD-10-CM

## 2017-04-01 ENCOUNTER — Ambulatory Visit (INDEPENDENT_AMBULATORY_CARE_PROVIDER_SITE_OTHER): Payer: BLUE CROSS/BLUE SHIELD | Admitting: *Deleted

## 2017-04-01 DIAGNOSIS — J309 Allergic rhinitis, unspecified: Secondary | ICD-10-CM | POA: Diagnosis not present

## 2017-04-08 ENCOUNTER — Ambulatory Visit (INDEPENDENT_AMBULATORY_CARE_PROVIDER_SITE_OTHER): Payer: BLUE CROSS/BLUE SHIELD | Admitting: *Deleted

## 2017-04-08 DIAGNOSIS — J309 Allergic rhinitis, unspecified: Secondary | ICD-10-CM | POA: Diagnosis not present

## 2017-04-15 ENCOUNTER — Ambulatory Visit (INDEPENDENT_AMBULATORY_CARE_PROVIDER_SITE_OTHER): Payer: BLUE CROSS/BLUE SHIELD | Admitting: *Deleted

## 2017-04-15 DIAGNOSIS — J309 Allergic rhinitis, unspecified: Secondary | ICD-10-CM | POA: Diagnosis not present

## 2017-04-17 NOTE — Progress Notes (Signed)
VIAL EXP 04-18-18 

## 2017-04-18 DIAGNOSIS — J3089 Other allergic rhinitis: Secondary | ICD-10-CM | POA: Diagnosis not present

## 2017-04-22 ENCOUNTER — Ambulatory Visit (INDEPENDENT_AMBULATORY_CARE_PROVIDER_SITE_OTHER): Payer: BLUE CROSS/BLUE SHIELD | Admitting: *Deleted

## 2017-04-22 DIAGNOSIS — J309 Allergic rhinitis, unspecified: Secondary | ICD-10-CM

## 2017-04-29 ENCOUNTER — Ambulatory Visit (INDEPENDENT_AMBULATORY_CARE_PROVIDER_SITE_OTHER): Payer: BLUE CROSS/BLUE SHIELD | Admitting: *Deleted

## 2017-04-29 DIAGNOSIS — J309 Allergic rhinitis, unspecified: Secondary | ICD-10-CM

## 2017-05-06 ENCOUNTER — Ambulatory Visit (INDEPENDENT_AMBULATORY_CARE_PROVIDER_SITE_OTHER): Payer: BLUE CROSS/BLUE SHIELD | Admitting: *Deleted

## 2017-05-06 DIAGNOSIS — J309 Allergic rhinitis, unspecified: Secondary | ICD-10-CM

## 2017-05-13 ENCOUNTER — Ambulatory Visit (INDEPENDENT_AMBULATORY_CARE_PROVIDER_SITE_OTHER): Payer: BLUE CROSS/BLUE SHIELD | Admitting: *Deleted

## 2017-05-13 DIAGNOSIS — J309 Allergic rhinitis, unspecified: Secondary | ICD-10-CM

## 2017-05-22 ENCOUNTER — Ambulatory Visit (INDEPENDENT_AMBULATORY_CARE_PROVIDER_SITE_OTHER): Payer: BLUE CROSS/BLUE SHIELD | Admitting: *Deleted

## 2017-05-22 DIAGNOSIS — J309 Allergic rhinitis, unspecified: Secondary | ICD-10-CM | POA: Diagnosis not present

## 2017-05-29 ENCOUNTER — Ambulatory Visit (INDEPENDENT_AMBULATORY_CARE_PROVIDER_SITE_OTHER): Payer: BLUE CROSS/BLUE SHIELD | Admitting: *Deleted

## 2017-05-29 DIAGNOSIS — J309 Allergic rhinitis, unspecified: Secondary | ICD-10-CM | POA: Diagnosis not present

## 2017-06-05 ENCOUNTER — Ambulatory Visit (INDEPENDENT_AMBULATORY_CARE_PROVIDER_SITE_OTHER): Payer: BLUE CROSS/BLUE SHIELD | Admitting: *Deleted

## 2017-06-05 DIAGNOSIS — J309 Allergic rhinitis, unspecified: Secondary | ICD-10-CM | POA: Diagnosis not present

## 2017-06-17 ENCOUNTER — Ambulatory Visit (INDEPENDENT_AMBULATORY_CARE_PROVIDER_SITE_OTHER): Payer: BLUE CROSS/BLUE SHIELD | Admitting: *Deleted

## 2017-06-17 DIAGNOSIS — J309 Allergic rhinitis, unspecified: Secondary | ICD-10-CM

## 2017-06-24 ENCOUNTER — Ambulatory Visit (INDEPENDENT_AMBULATORY_CARE_PROVIDER_SITE_OTHER): Payer: BLUE CROSS/BLUE SHIELD | Admitting: *Deleted

## 2017-06-24 DIAGNOSIS — J309 Allergic rhinitis, unspecified: Secondary | ICD-10-CM | POA: Diagnosis not present

## 2017-06-26 ENCOUNTER — Other Ambulatory Visit (HOSPITAL_COMMUNITY): Payer: Self-pay | Admitting: Chiropractic Medicine

## 2017-06-26 ENCOUNTER — Ambulatory Visit (HOSPITAL_COMMUNITY)
Admission: RE | Admit: 2017-06-26 | Discharge: 2017-06-26 | Disposition: A | Discharge status: Home / Self Care | Payer: No Typology Code available for payment source | Source: Ambulatory Visit | Attending: Chiropractic Medicine | Admitting: Chiropractic Medicine

## 2017-06-26 DIAGNOSIS — M549 Dorsalgia, unspecified: Secondary | ICD-10-CM

## 2017-06-26 DIAGNOSIS — M542 Cervicalgia: Secondary | ICD-10-CM | POA: Insufficient documentation

## 2017-06-26 DIAGNOSIS — M545 Low back pain: Secondary | ICD-10-CM | POA: Diagnosis not present

## 2017-07-01 ENCOUNTER — Ambulatory Visit (INDEPENDENT_AMBULATORY_CARE_PROVIDER_SITE_OTHER): Payer: BLUE CROSS/BLUE SHIELD | Admitting: *Deleted

## 2017-07-01 DIAGNOSIS — J309 Allergic rhinitis, unspecified: Secondary | ICD-10-CM

## 2017-07-10 ENCOUNTER — Ambulatory Visit (INDEPENDENT_AMBULATORY_CARE_PROVIDER_SITE_OTHER): Payer: BLUE CROSS/BLUE SHIELD | Admitting: *Deleted

## 2017-07-10 DIAGNOSIS — J309 Allergic rhinitis, unspecified: Secondary | ICD-10-CM

## 2017-07-17 ENCOUNTER — Ambulatory Visit (INDEPENDENT_AMBULATORY_CARE_PROVIDER_SITE_OTHER): Payer: BLUE CROSS/BLUE SHIELD | Admitting: *Deleted

## 2017-07-17 DIAGNOSIS — J309 Allergic rhinitis, unspecified: Secondary | ICD-10-CM | POA: Diagnosis not present

## 2017-07-24 ENCOUNTER — Ambulatory Visit (INDEPENDENT_AMBULATORY_CARE_PROVIDER_SITE_OTHER): Payer: BLUE CROSS/BLUE SHIELD | Admitting: *Deleted

## 2017-07-24 DIAGNOSIS — J309 Allergic rhinitis, unspecified: Secondary | ICD-10-CM

## 2017-07-29 ENCOUNTER — Ambulatory Visit (INDEPENDENT_AMBULATORY_CARE_PROVIDER_SITE_OTHER): Payer: BLUE CROSS/BLUE SHIELD | Admitting: *Deleted

## 2017-07-29 DIAGNOSIS — J309 Allergic rhinitis, unspecified: Secondary | ICD-10-CM

## 2017-08-12 ENCOUNTER — Ambulatory Visit (INDEPENDENT_AMBULATORY_CARE_PROVIDER_SITE_OTHER): Payer: BLUE CROSS/BLUE SHIELD | Admitting: *Deleted

## 2017-08-12 DIAGNOSIS — J309 Allergic rhinitis, unspecified: Secondary | ICD-10-CM

## 2017-08-19 ENCOUNTER — Ambulatory Visit (INDEPENDENT_AMBULATORY_CARE_PROVIDER_SITE_OTHER): Payer: BLUE CROSS/BLUE SHIELD | Admitting: *Deleted

## 2017-08-19 DIAGNOSIS — J309 Allergic rhinitis, unspecified: Secondary | ICD-10-CM | POA: Diagnosis not present

## 2017-08-26 ENCOUNTER — Ambulatory Visit (INDEPENDENT_AMBULATORY_CARE_PROVIDER_SITE_OTHER): Payer: BLUE CROSS/BLUE SHIELD | Admitting: *Deleted

## 2017-08-26 DIAGNOSIS — J309 Allergic rhinitis, unspecified: Secondary | ICD-10-CM

## 2017-09-02 ENCOUNTER — Ambulatory Visit (INDEPENDENT_AMBULATORY_CARE_PROVIDER_SITE_OTHER): Payer: BLUE CROSS/BLUE SHIELD | Admitting: *Deleted

## 2017-09-02 DIAGNOSIS — J309 Allergic rhinitis, unspecified: Secondary | ICD-10-CM | POA: Diagnosis not present

## 2017-09-16 ENCOUNTER — Ambulatory Visit (INDEPENDENT_AMBULATORY_CARE_PROVIDER_SITE_OTHER): Payer: BLUE CROSS/BLUE SHIELD | Admitting: *Deleted

## 2017-09-16 DIAGNOSIS — J309 Allergic rhinitis, unspecified: Secondary | ICD-10-CM

## 2017-09-23 ENCOUNTER — Ambulatory Visit (INDEPENDENT_AMBULATORY_CARE_PROVIDER_SITE_OTHER): Payer: BLUE CROSS/BLUE SHIELD | Admitting: *Deleted

## 2017-09-23 DIAGNOSIS — J309 Allergic rhinitis, unspecified: Secondary | ICD-10-CM

## 2017-09-30 ENCOUNTER — Ambulatory Visit (INDEPENDENT_AMBULATORY_CARE_PROVIDER_SITE_OTHER): Payer: BLUE CROSS/BLUE SHIELD | Admitting: *Deleted

## 2017-09-30 DIAGNOSIS — J309 Allergic rhinitis, unspecified: Secondary | ICD-10-CM | POA: Diagnosis not present

## 2017-10-07 ENCOUNTER — Ambulatory Visit (INDEPENDENT_AMBULATORY_CARE_PROVIDER_SITE_OTHER): Payer: BLUE CROSS/BLUE SHIELD | Admitting: *Deleted

## 2017-10-07 DIAGNOSIS — J309 Allergic rhinitis, unspecified: Secondary | ICD-10-CM

## 2017-10-14 ENCOUNTER — Ambulatory Visit (INDEPENDENT_AMBULATORY_CARE_PROVIDER_SITE_OTHER): Payer: BLUE CROSS/BLUE SHIELD | Admitting: *Deleted

## 2017-10-14 DIAGNOSIS — J309 Allergic rhinitis, unspecified: Secondary | ICD-10-CM

## 2017-10-21 ENCOUNTER — Ambulatory Visit (INDEPENDENT_AMBULATORY_CARE_PROVIDER_SITE_OTHER): Payer: BLUE CROSS/BLUE SHIELD | Admitting: *Deleted

## 2017-10-21 DIAGNOSIS — J309 Allergic rhinitis, unspecified: Secondary | ICD-10-CM | POA: Diagnosis not present

## 2017-10-30 ENCOUNTER — Ambulatory Visit (INDEPENDENT_AMBULATORY_CARE_PROVIDER_SITE_OTHER): Payer: BLUE CROSS/BLUE SHIELD

## 2017-10-30 DIAGNOSIS — J309 Allergic rhinitis, unspecified: Secondary | ICD-10-CM

## 2017-11-04 ENCOUNTER — Ambulatory Visit (INDEPENDENT_AMBULATORY_CARE_PROVIDER_SITE_OTHER): Payer: BLUE CROSS/BLUE SHIELD | Admitting: *Deleted

## 2017-11-04 DIAGNOSIS — J309 Allergic rhinitis, unspecified: Secondary | ICD-10-CM | POA: Diagnosis not present

## 2017-11-06 ENCOUNTER — Encounter: Payer: Self-pay | Admitting: Allergy & Immunology

## 2017-11-06 ENCOUNTER — Ambulatory Visit: Payer: BLUE CROSS/BLUE SHIELD | Admitting: Allergy & Immunology

## 2017-11-06 VITALS — BP 102/60 | HR 80 | Resp 20

## 2017-11-06 DIAGNOSIS — J3089 Other allergic rhinitis: Secondary | ICD-10-CM

## 2017-11-06 MED ORDER — EPINEPHRINE 0.3 MG/0.3ML IJ SOAJ: INTRAMUSCULAR | 2 refills | Status: DC

## 2017-11-06 NOTE — Patient Instructions (Addendum)
1. Chronic nonseasonal allergic rhinitis - on immunotherapy - We will continue with the injections, but we will advance every two weeks now since you have been stable for so long.  - Please take an extra Zyrtec (cetirizine) on the day that you have shots so that you can tolerate the advances better.  - We will send in a renewal for AuviQ.   2. Return in about 1 year (around 11/07/2018).  Please inform us of any Emergency Department visits, hospitalizations, or changes in symptoms. Call us before going to the ED for breathing or allergy symptoms since we might be able to fit you in for a sick visit. Feel free to contact us anytime with any questions, problems, or concerns.  It was a pleasure to see you again today! Happy summer!   Websites that have reliable patient information: 1. American Academy of Asthma, Allergy, and Immunology: www.aaaai.org 2. Food Allergy Research and Education (FARE): foodallergy.org 3. Mothers of Asthmatics: http://www.asthmacommunitynetwork.org 4. American College of Allergy, Asthma, and Immunology: www.acaai.org

## 2017-11-06 NOTE — Progress Notes (Signed)
FOLLOW UP  Date of Service/Encounter:  11/06/17   Assessment:   Perennial allergic rhinitis (dust mite) - on immunotherapy (maintenance reached in February 2017)  Plan/Recommendations:    1. Chronic nonseasonal allergic rhinitis - on immunotherapy - We will continue with the injections, but we will advance every two weeks now since you have been stable for so long.  - Please take an extra Zyrtec (cetirizine) on the day that you have shots so that you can tolerate the advances better.  - We will send in a renewal for AuviQ.   2. Return in about 1 year (around 11/07/2018).  Subjective:   Stacy Howard is a 27 y.o. female presenting today for follow up of  Chief Complaint  Patient presents with  . Allergic Rhinitis     Stacy Howard has a history of the following: Patient Active Problem List   Diagnosis Date Noted  . Perennial allergic rhinitis 01/30/2015  . Allergic conjunctivitis 01/30/2015    History obtained from: chart review and patient.  Stacy Howard's Primary Care Provider is Molinda Bailiff, [place].     Stacy Howard is a 27 y.o. female presenting for a follow up visit.  She was last seen in March 2018.  She has a history of perennial allergic rhinitis with sensitizations to dust mite only.  When I last saw her in March 2018, she had had a recent reaction to her allergy shots.  I recommended taking an extra cetirizine on her shot days.  Since the last visit, she has done very well. She has been maintained on her allergy shots, which have been frozen at 0.45mL of the Red Vial for quite some time. However, she had previously gotten much higher than that. Her reactions were rather concerning with throat tightness and large local reactions. Some would take upwards of 12 hours to manifest. In any case, she has been frozen at 0.74mL since September 2018.   Stacy Howard is on allergen immunotherapy. She receives one injection. Immunotherapy script #1 contains dust mites. She  currently receives 0.58mL of the RED vial (1/100). She reached maintenance in February 2017. She started shots sometime in 2016 and has mostly tolerated these well. She has had reactions to some of her shots as mentioned above. She takes cetirizine on a daily basis.  Otherwise, there have been no changes to her past medical history, surgical history, family history, or social history. She is otherwise healthy. 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 She has an undergraduate degree in history and a minor in Investment banker, corporate.     Review of Systems: a 14-point review of systems is pertinent for what is mentioned in HPI.  Otherwise, all other systems were negative. Constitutional: negative other than that listed in the HPI Eyes: negative other than that listed in the HPI Ears, nose, mouth, throat, and face: negative other than that listed in the HPI Respiratory: negative other than that listed in the HPI Cardiovascular: negative other than that listed in the HPI Gastrointestinal: negative other than that listed in the HPI Genitourinary: negative other than that listed in the HPI Integument: negative other than that listed in the HPI Hematologic: negative other than that listed in the HPI Musculoskeletal: negative other than that listed in the HPI Neurological: negative other than that listed in the HPI Allergy/Immunologic: negative other than  that listed in the HPI    Objective:   Blood pressure 102/60, pulse 80, resp. rate 20. There is no height or weight on file to calculate BMI.   Physical Exam:  General: Alert, interactive, in no acute distress. Pleasant but quiet.  Eyes: No conjunctival injection bilaterally, no discharge on the right, no discharge on the left and no Horner-Trantas dots present. PERRL bilaterally. EOMI without  pain. No photophobia.  Ears: Right TM pearly gray with normal light reflex, Left TM pearly gray with normal light reflex, Right TM intact without perforation and Left TM intact without perforation.  Nose/Throat: External nose within normal limits and septum midline. Turbinates edematous with clear discharge. Posterior oropharynx erythematous without cobblestoning in the posterior oropharynx. Tonsils 2+ without exudates.  Tongue without thrush. Lungs: Clear to auscultation without wheezing, rhonchi or rales. No increased work of breathing. CV: Normal S1/S2. No murmurs. Capillary refill <2 seconds.  Skin: Warm and dry, without lesions or rashes. Neuro:   Grossly intact. No focal deficits appreciated. Responsive to questions.  Diagnostic studies: none     Stacy Bonds, MD  [facility]

## 2017-11-11 ENCOUNTER — Ambulatory Visit (INDEPENDENT_AMBULATORY_CARE_PROVIDER_SITE_OTHER): Payer: BLUE CROSS/BLUE SHIELD | Admitting: *Deleted

## 2017-11-11 DIAGNOSIS — J309 Allergic rhinitis, unspecified: Secondary | ICD-10-CM | POA: Diagnosis not present

## 2017-11-18 ENCOUNTER — Ambulatory Visit (INDEPENDENT_AMBULATORY_CARE_PROVIDER_SITE_OTHER): Payer: BLUE CROSS/BLUE SHIELD | Admitting: *Deleted

## 2017-11-18 DIAGNOSIS — J309 Allergic rhinitis, unspecified: Secondary | ICD-10-CM

## 2017-11-19 ENCOUNTER — Telehealth: Payer: Self-pay | Admitting: *Deleted

## 2017-11-19 NOTE — Telephone Encounter (Signed)
Patient came into the office yesterday for her injection. She was increased to 0.15 last week and she stated about an hour after the injection she started having chest tightness and wheezing and itchy throat again. Decreased patient back to 0.10. Do you want to freeze her at that dose again? Please advise.

## 2017-11-19 NOTE — Telephone Encounter (Signed)
Yes we will freeze at 0.1 mL indefinitely. Brrrrrr!  [name], MD [facility] [place] [place]

## 2017-11-20 NOTE — Telephone Encounter (Signed)
Noted in chart.

## 2017-11-27 ENCOUNTER — Ambulatory Visit (INDEPENDENT_AMBULATORY_CARE_PROVIDER_SITE_OTHER): Payer: BLUE CROSS/BLUE SHIELD | Admitting: *Deleted

## 2017-11-27 DIAGNOSIS — J309 Allergic rhinitis, unspecified: Secondary | ICD-10-CM

## 2017-12-02 ENCOUNTER — Ambulatory Visit (INDEPENDENT_AMBULATORY_CARE_PROVIDER_SITE_OTHER): Payer: BLUE CROSS/BLUE SHIELD | Admitting: *Deleted

## 2017-12-02 DIAGNOSIS — J309 Allergic rhinitis, unspecified: Secondary | ICD-10-CM

## 2017-12-09 ENCOUNTER — Ambulatory Visit (INDEPENDENT_AMBULATORY_CARE_PROVIDER_SITE_OTHER): Payer: BLUE CROSS/BLUE SHIELD | Admitting: *Deleted

## 2017-12-09 DIAGNOSIS — J309 Allergic rhinitis, unspecified: Secondary | ICD-10-CM | POA: Diagnosis not present

## 2017-12-23 ENCOUNTER — Ambulatory Visit (INDEPENDENT_AMBULATORY_CARE_PROVIDER_SITE_OTHER): Payer: BLUE CROSS/BLUE SHIELD | Admitting: *Deleted

## 2017-12-23 DIAGNOSIS — J309 Allergic rhinitis, unspecified: Secondary | ICD-10-CM

## 2018-01-08 ENCOUNTER — Ambulatory Visit (INDEPENDENT_AMBULATORY_CARE_PROVIDER_SITE_OTHER): Payer: BLUE CROSS/BLUE SHIELD | Admitting: *Deleted

## 2018-01-08 DIAGNOSIS — J309 Allergic rhinitis, unspecified: Secondary | ICD-10-CM | POA: Diagnosis not present

## 2018-01-27 ENCOUNTER — Ambulatory Visit (INDEPENDENT_AMBULATORY_CARE_PROVIDER_SITE_OTHER): Payer: BLUE CROSS/BLUE SHIELD | Admitting: *Deleted

## 2018-01-27 DIAGNOSIS — J309 Allergic rhinitis, unspecified: Secondary | ICD-10-CM | POA: Diagnosis not present

## 2018-02-04 DIAGNOSIS — J3089 Other allergic rhinitis: Secondary | ICD-10-CM

## 2018-02-04 NOTE — Progress Notes (Signed)
VIAL EXP 02-05-19 

## 2018-02-10 ENCOUNTER — Ambulatory Visit (INDEPENDENT_AMBULATORY_CARE_PROVIDER_SITE_OTHER): Payer: BLUE CROSS/BLUE SHIELD

## 2018-02-10 DIAGNOSIS — J309 Allergic rhinitis, unspecified: Secondary | ICD-10-CM | POA: Diagnosis not present

## 2018-02-26 ENCOUNTER — Ambulatory Visit (INDEPENDENT_AMBULATORY_CARE_PROVIDER_SITE_OTHER): Payer: BLUE CROSS/BLUE SHIELD | Admitting: *Deleted

## 2018-02-26 DIAGNOSIS — J309 Allergic rhinitis, unspecified: Secondary | ICD-10-CM | POA: Diagnosis not present

## 2018-03-12 ENCOUNTER — Ambulatory Visit (INDEPENDENT_AMBULATORY_CARE_PROVIDER_SITE_OTHER): Payer: BLUE CROSS/BLUE SHIELD | Admitting: *Deleted

## 2018-03-12 DIAGNOSIS — J309 Allergic rhinitis, unspecified: Secondary | ICD-10-CM | POA: Diagnosis not present

## 2018-03-26 ENCOUNTER — Ambulatory Visit (INDEPENDENT_AMBULATORY_CARE_PROVIDER_SITE_OTHER): Payer: BLUE CROSS/BLUE SHIELD | Admitting: *Deleted

## 2018-03-26 DIAGNOSIS — J309 Allergic rhinitis, unspecified: Secondary | ICD-10-CM | POA: Diagnosis not present

## 2018-04-14 ENCOUNTER — Ambulatory Visit (INDEPENDENT_AMBULATORY_CARE_PROVIDER_SITE_OTHER): Payer: BLUE CROSS/BLUE SHIELD

## 2018-04-14 DIAGNOSIS — J309 Allergic rhinitis, unspecified: Secondary | ICD-10-CM

## 2018-04-30 ENCOUNTER — Ambulatory Visit (INDEPENDENT_AMBULATORY_CARE_PROVIDER_SITE_OTHER): Payer: BLUE CROSS/BLUE SHIELD | Admitting: *Deleted

## 2018-04-30 DIAGNOSIS — J309 Allergic rhinitis, unspecified: Secondary | ICD-10-CM | POA: Diagnosis not present

## 2018-05-14 ENCOUNTER — Ambulatory Visit (INDEPENDENT_AMBULATORY_CARE_PROVIDER_SITE_OTHER): Payer: BLUE CROSS/BLUE SHIELD | Admitting: *Deleted

## 2018-05-14 DIAGNOSIS — J309 Allergic rhinitis, unspecified: Secondary | ICD-10-CM

## 2018-05-28 ENCOUNTER — Ambulatory Visit (INDEPENDENT_AMBULATORY_CARE_PROVIDER_SITE_OTHER): Payer: BLUE CROSS/BLUE SHIELD | Admitting: *Deleted

## 2018-05-28 DIAGNOSIS — J309 Allergic rhinitis, unspecified: Secondary | ICD-10-CM

## 2018-06-11 ENCOUNTER — Ambulatory Visit (INDEPENDENT_AMBULATORY_CARE_PROVIDER_SITE_OTHER): Payer: BLUE CROSS/BLUE SHIELD

## 2018-06-11 DIAGNOSIS — J309 Allergic rhinitis, unspecified: Secondary | ICD-10-CM

## 2018-06-25 ENCOUNTER — Ambulatory Visit (INDEPENDENT_AMBULATORY_CARE_PROVIDER_SITE_OTHER): Payer: BLUE CROSS/BLUE SHIELD | Admitting: *Deleted

## 2018-06-25 DIAGNOSIS — J309 Allergic rhinitis, unspecified: Secondary | ICD-10-CM

## 2018-07-09 ENCOUNTER — Ambulatory Visit (INDEPENDENT_AMBULATORY_CARE_PROVIDER_SITE_OTHER): Payer: BLUE CROSS/BLUE SHIELD | Admitting: *Deleted

## 2018-07-09 DIAGNOSIS — J309 Allergic rhinitis, unspecified: Secondary | ICD-10-CM | POA: Diagnosis not present

## 2018-07-23 ENCOUNTER — Ambulatory Visit (INDEPENDENT_AMBULATORY_CARE_PROVIDER_SITE_OTHER): Payer: BLUE CROSS/BLUE SHIELD | Admitting: *Deleted

## 2018-07-23 DIAGNOSIS — J309 Allergic rhinitis, unspecified: Secondary | ICD-10-CM | POA: Diagnosis not present

## 2018-08-04 ENCOUNTER — Ambulatory Visit (INDEPENDENT_AMBULATORY_CARE_PROVIDER_SITE_OTHER): Payer: BLUE CROSS/BLUE SHIELD | Admitting: *Deleted

## 2018-08-04 DIAGNOSIS — J309 Allergic rhinitis, unspecified: Secondary | ICD-10-CM

## 2018-08-20 ENCOUNTER — Ambulatory Visit (INDEPENDENT_AMBULATORY_CARE_PROVIDER_SITE_OTHER): Payer: BLUE CROSS/BLUE SHIELD | Admitting: *Deleted

## 2018-08-20 DIAGNOSIS — J309 Allergic rhinitis, unspecified: Secondary | ICD-10-CM | POA: Diagnosis not present

## 2018-09-01 ENCOUNTER — Ambulatory Visit (INDEPENDENT_AMBULATORY_CARE_PROVIDER_SITE_OTHER): Payer: BLUE CROSS/BLUE SHIELD | Admitting: *Deleted

## 2018-09-01 DIAGNOSIS — J309 Allergic rhinitis, unspecified: Secondary | ICD-10-CM

## 2018-09-17 ENCOUNTER — Ambulatory Visit (INDEPENDENT_AMBULATORY_CARE_PROVIDER_SITE_OTHER): Payer: BLUE CROSS/BLUE SHIELD | Admitting: *Deleted

## 2018-09-17 DIAGNOSIS — J309 Allergic rhinitis, unspecified: Secondary | ICD-10-CM

## 2018-10-01 ENCOUNTER — Ambulatory Visit (INDEPENDENT_AMBULATORY_CARE_PROVIDER_SITE_OTHER): Payer: BLUE CROSS/BLUE SHIELD | Admitting: *Deleted

## 2018-10-01 DIAGNOSIS — J309 Allergic rhinitis, unspecified: Secondary | ICD-10-CM | POA: Diagnosis not present

## 2018-10-15 ENCOUNTER — Ambulatory Visit (INDEPENDENT_AMBULATORY_CARE_PROVIDER_SITE_OTHER): Payer: BLUE CROSS/BLUE SHIELD

## 2018-10-15 DIAGNOSIS — J309 Allergic rhinitis, unspecified: Secondary | ICD-10-CM

## 2018-10-27 ENCOUNTER — Ambulatory Visit (INDEPENDENT_AMBULATORY_CARE_PROVIDER_SITE_OTHER): Payer: BLUE CROSS/BLUE SHIELD

## 2018-10-27 DIAGNOSIS — J309 Allergic rhinitis, unspecified: Secondary | ICD-10-CM

## 2018-11-17 ENCOUNTER — Ambulatory Visit (INDEPENDENT_AMBULATORY_CARE_PROVIDER_SITE_OTHER): Payer: BLUE CROSS/BLUE SHIELD | Admitting: *Deleted

## 2018-11-17 DIAGNOSIS — J309 Allergic rhinitis, unspecified: Secondary | ICD-10-CM

## 2018-12-01 ENCOUNTER — Ambulatory Visit (INDEPENDENT_AMBULATORY_CARE_PROVIDER_SITE_OTHER): Payer: BLUE CROSS/BLUE SHIELD | Admitting: *Deleted

## 2018-12-01 DIAGNOSIS — J309 Allergic rhinitis, unspecified: Secondary | ICD-10-CM | POA: Diagnosis not present

## 2018-12-17 ENCOUNTER — Ambulatory Visit (INDEPENDENT_AMBULATORY_CARE_PROVIDER_SITE_OTHER): Payer: BLUE CROSS/BLUE SHIELD | Admitting: *Deleted

## 2018-12-17 DIAGNOSIS — J309 Allergic rhinitis, unspecified: Secondary | ICD-10-CM

## 2018-12-30 DIAGNOSIS — J3089 Other allergic rhinitis: Secondary | ICD-10-CM

## 2018-12-30 NOTE — Progress Notes (Signed)
VIAL EXP 12-30-2019 

## 2018-12-31 ENCOUNTER — Ambulatory Visit (INDEPENDENT_AMBULATORY_CARE_PROVIDER_SITE_OTHER): Payer: BLUE CROSS/BLUE SHIELD | Admitting: *Deleted

## 2018-12-31 DIAGNOSIS — J309 Allergic rhinitis, unspecified: Secondary | ICD-10-CM

## 2019-01-14 ENCOUNTER — Ambulatory Visit (INDEPENDENT_AMBULATORY_CARE_PROVIDER_SITE_OTHER): Payer: BC Managed Care – PPO | Admitting: *Deleted

## 2019-01-14 DIAGNOSIS — J309 Allergic rhinitis, unspecified: Secondary | ICD-10-CM | POA: Diagnosis not present

## 2019-01-26 ENCOUNTER — Ambulatory Visit (INDEPENDENT_AMBULATORY_CARE_PROVIDER_SITE_OTHER): Payer: BC Managed Care – PPO | Admitting: *Deleted

## 2019-01-26 DIAGNOSIS — J309 Allergic rhinitis, unspecified: Secondary | ICD-10-CM | POA: Diagnosis not present

## 2019-02-11 ENCOUNTER — Ambulatory Visit (INDEPENDENT_AMBULATORY_CARE_PROVIDER_SITE_OTHER): Payer: BC Managed Care – PPO | Admitting: *Deleted

## 2019-02-11 DIAGNOSIS — J309 Allergic rhinitis, unspecified: Secondary | ICD-10-CM

## 2019-02-18 ENCOUNTER — Ambulatory Visit (INDEPENDENT_AMBULATORY_CARE_PROVIDER_SITE_OTHER): Payer: BC Managed Care – PPO | Admitting: *Deleted

## 2019-02-18 DIAGNOSIS — J309 Allergic rhinitis, unspecified: Secondary | ICD-10-CM

## 2019-03-02 ENCOUNTER — Ambulatory Visit (INDEPENDENT_AMBULATORY_CARE_PROVIDER_SITE_OTHER): Payer: BC Managed Care – PPO | Admitting: *Deleted

## 2019-03-02 DIAGNOSIS — J309 Allergic rhinitis, unspecified: Secondary | ICD-10-CM

## 2019-03-16 ENCOUNTER — Ambulatory Visit (INDEPENDENT_AMBULATORY_CARE_PROVIDER_SITE_OTHER): Payer: BC Managed Care – PPO

## 2019-03-16 DIAGNOSIS — J309 Allergic rhinitis, unspecified: Secondary | ICD-10-CM

## 2019-03-30 ENCOUNTER — Ambulatory Visit (INDEPENDENT_AMBULATORY_CARE_PROVIDER_SITE_OTHER): Payer: BC Managed Care – PPO | Admitting: *Deleted

## 2019-03-30 DIAGNOSIS — J309 Allergic rhinitis, unspecified: Secondary | ICD-10-CM

## 2019-04-15 ENCOUNTER — Ambulatory Visit (INDEPENDENT_AMBULATORY_CARE_PROVIDER_SITE_OTHER): Payer: BC Managed Care – PPO | Admitting: *Deleted

## 2019-04-15 DIAGNOSIS — J309 Allergic rhinitis, unspecified: Secondary | ICD-10-CM

## 2019-04-27 ENCOUNTER — Ambulatory Visit (INDEPENDENT_AMBULATORY_CARE_PROVIDER_SITE_OTHER): Payer: BC Managed Care – PPO | Admitting: *Deleted

## 2019-04-27 DIAGNOSIS — J309 Allergic rhinitis, unspecified: Secondary | ICD-10-CM | POA: Diagnosis not present

## 2019-05-19 IMAGING — DX DG LUMBAR SPINE 2-3V
3 series · 3 of 3 positions shown · non-contrast
Comparison: None.

CLINICAL DATA: Acute lower back pain without known injury.

EXAM:
LUMBAR SPINE - 2-3 VIEW

[l-spine ap]
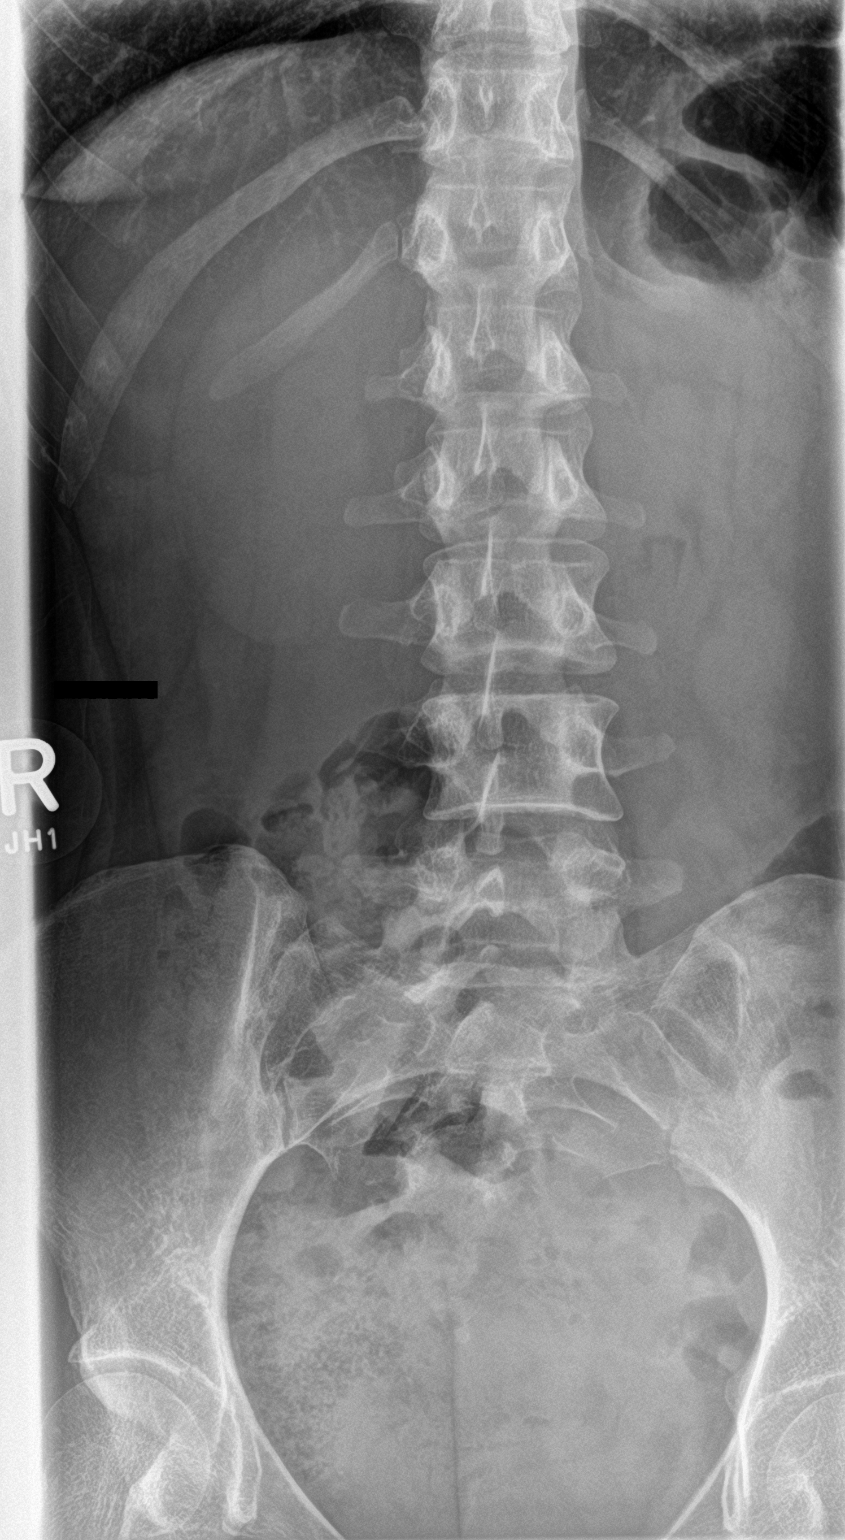

[l-spine lat]
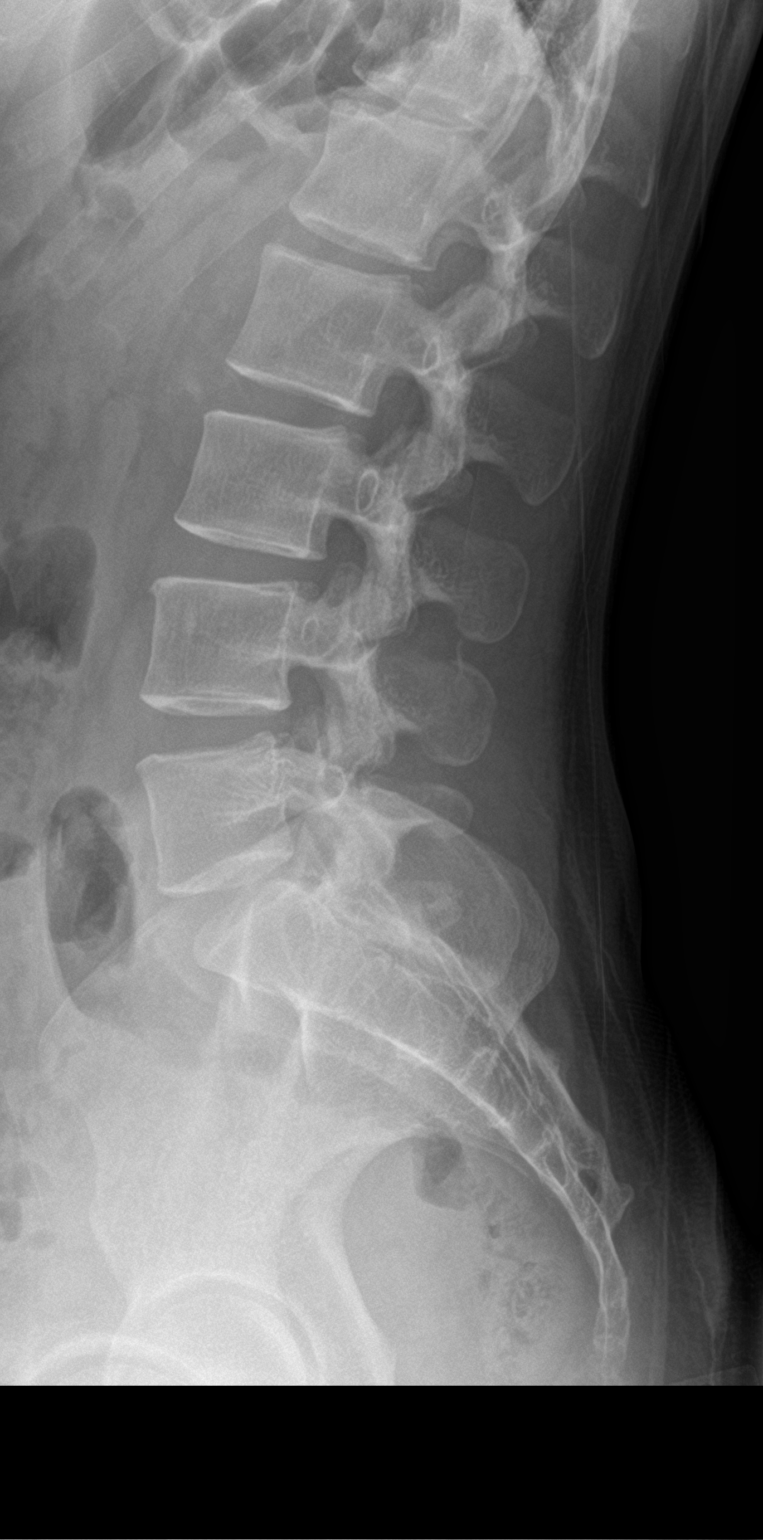

[l-spine spot]
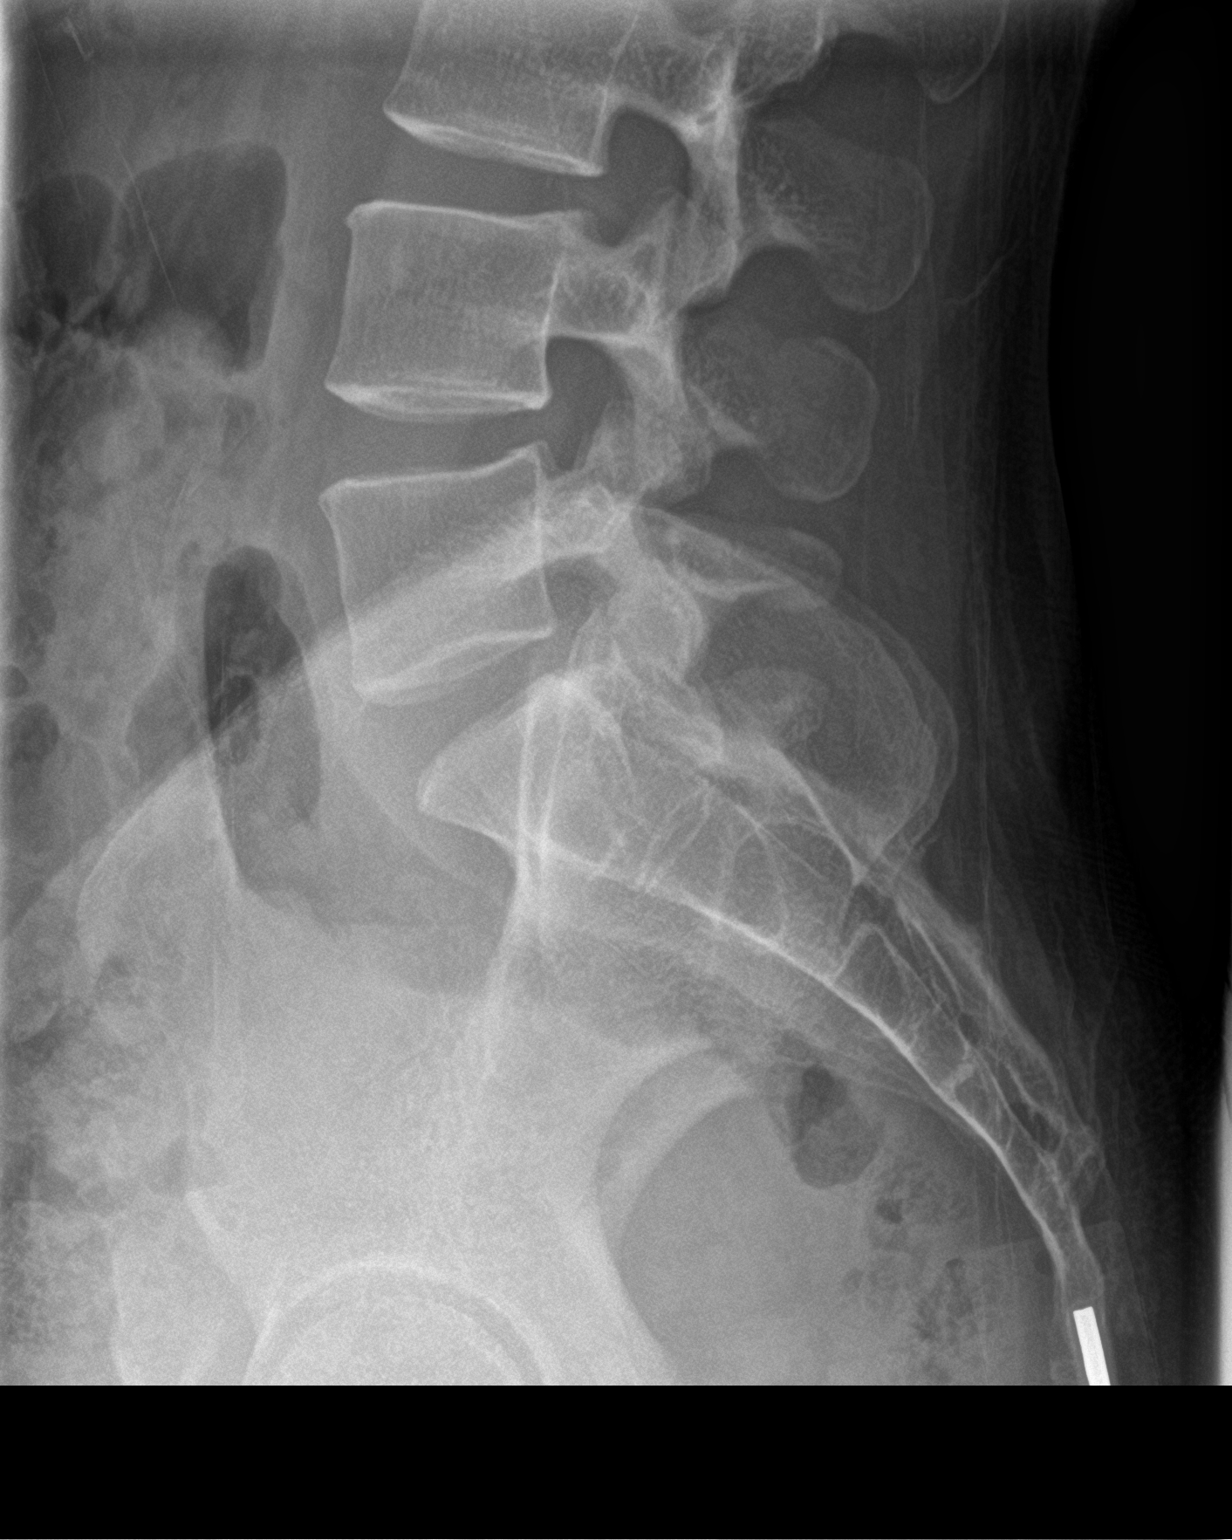

[3 of 3 positions shown; findings below may reference images not displayed]

FINDINGS: There is no evidence of lumbar spine fracture. Alignment is normal.
Intervertebral disc spaces are maintained.
IMPRESSION: Normal lumbar spine.

## 2019-05-19 IMAGING — DX DG CERVICAL SPINE 2 OR 3 VIEWS
5 series · 5 of 5 positions shown · non-contrast
Comparison: None.

CLINICAL DATA: Neck pain

EXAM:
CERVICAL SPINE - 2-3 VIEW

[c-spine lat]
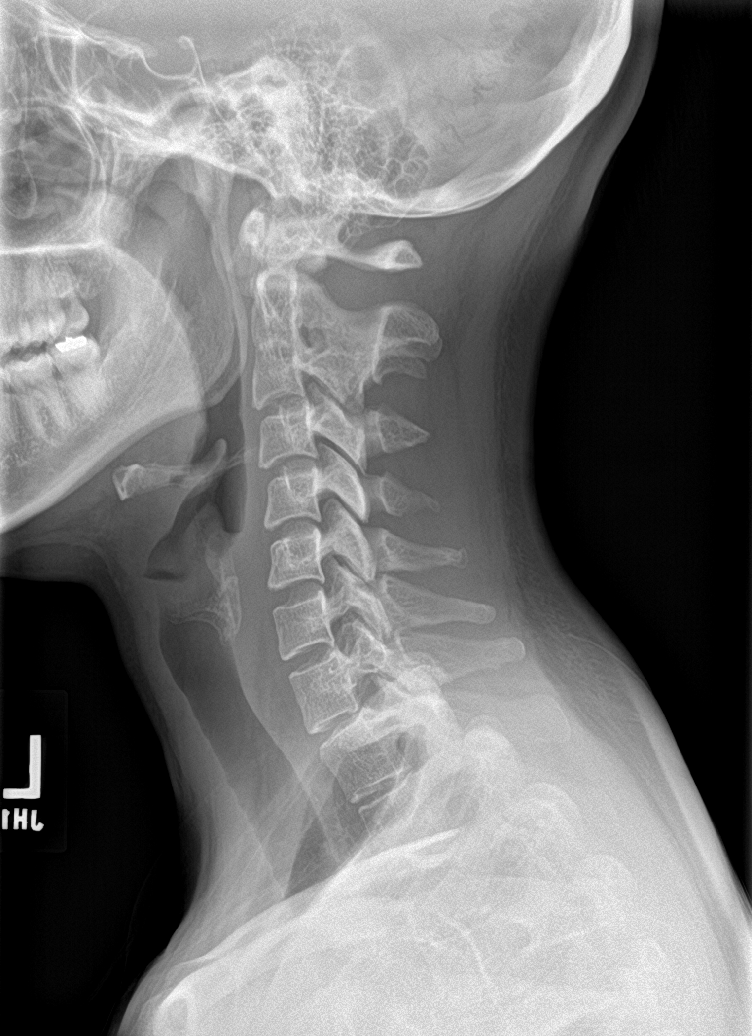

[c-spine ap]
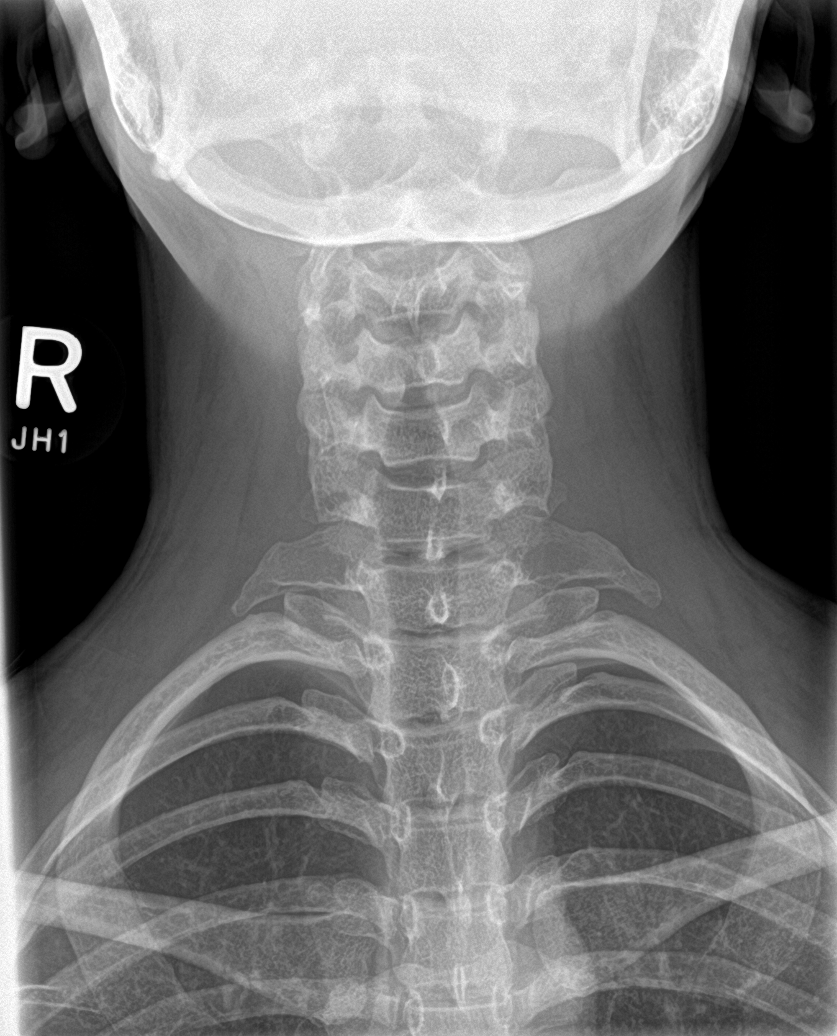

[c-spine open mouth (1 of 3)]
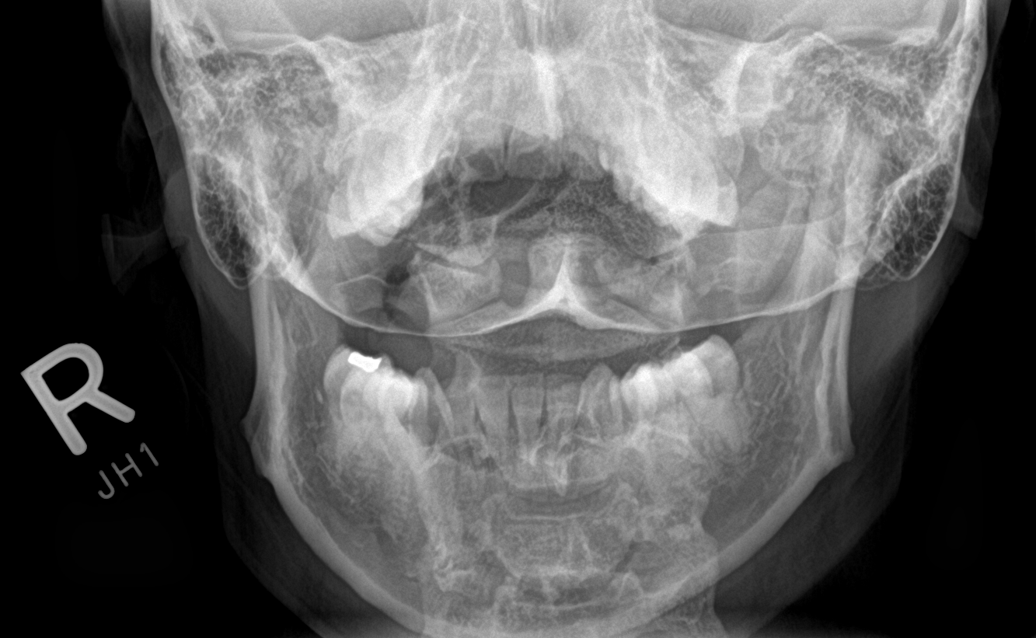

[c-spine open mouth (2 of 3)]
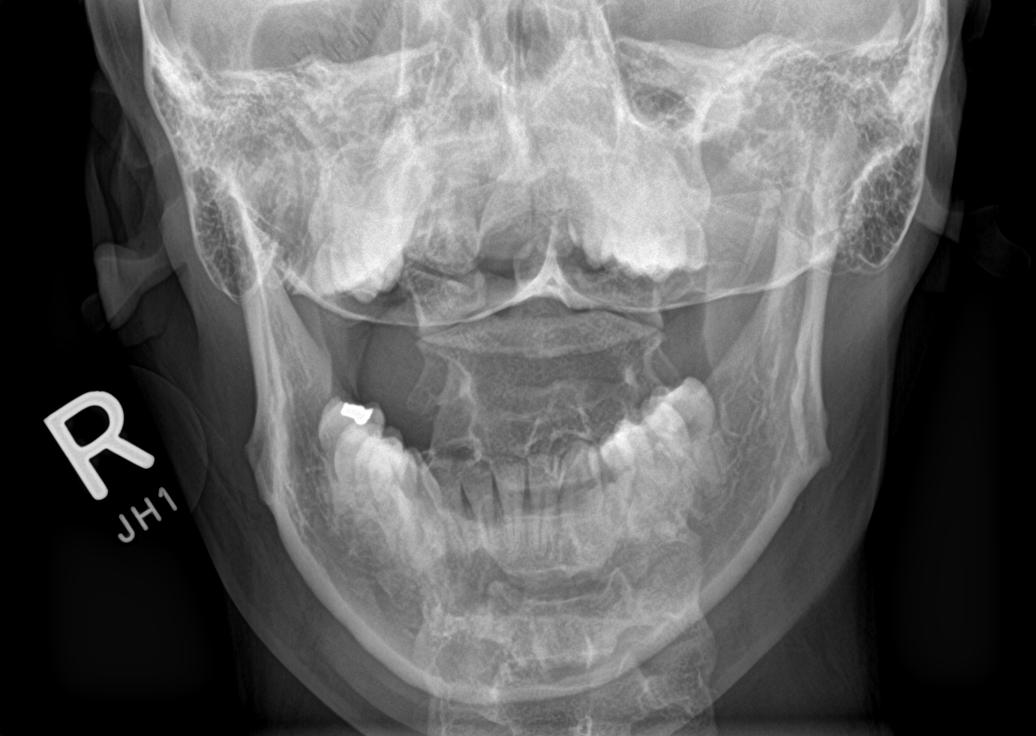

[c-spine open mouth (3 of 3)]
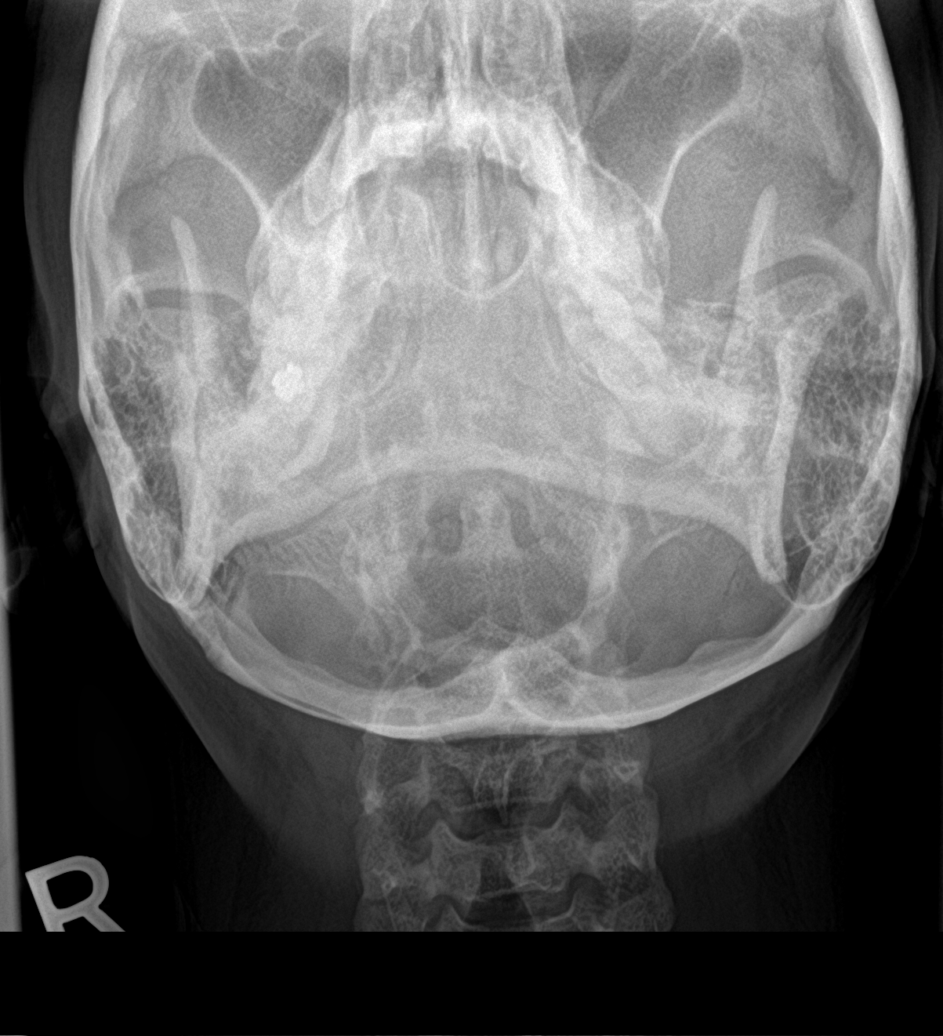

[5 of 5 positions shown; findings below may reference images not displayed]

FINDINGS: There is no evidence of cervical spine fracture or prevertebral soft
tissue swelling. Alignment is normal. No other significant bone
abnormalities are identified. Incidental note of bilateral cervical
ribs.
IMPRESSION: Negative cervical spine radiographs.

## 2019-05-20 ENCOUNTER — Ambulatory Visit (INDEPENDENT_AMBULATORY_CARE_PROVIDER_SITE_OTHER): Payer: BC Managed Care – PPO

## 2019-05-20 DIAGNOSIS — J309 Allergic rhinitis, unspecified: Secondary | ICD-10-CM | POA: Diagnosis not present

## 2019-06-08 ENCOUNTER — Ambulatory Visit (INDEPENDENT_AMBULATORY_CARE_PROVIDER_SITE_OTHER): Payer: BC Managed Care – PPO

## 2019-06-08 DIAGNOSIS — J309 Allergic rhinitis, unspecified: Secondary | ICD-10-CM | POA: Diagnosis not present

## 2019-06-22 ENCOUNTER — Ambulatory Visit (INDEPENDENT_AMBULATORY_CARE_PROVIDER_SITE_OTHER): Payer: BC Managed Care – PPO

## 2019-06-22 DIAGNOSIS — J309 Allergic rhinitis, unspecified: Secondary | ICD-10-CM

## 2019-07-13 ENCOUNTER — Ambulatory Visit (INDEPENDENT_AMBULATORY_CARE_PROVIDER_SITE_OTHER): Payer: BC Managed Care – PPO

## 2019-07-13 DIAGNOSIS — J309 Allergic rhinitis, unspecified: Secondary | ICD-10-CM

## 2019-08-03 ENCOUNTER — Ambulatory Visit (INDEPENDENT_AMBULATORY_CARE_PROVIDER_SITE_OTHER): Payer: BC Managed Care – PPO

## 2019-08-03 DIAGNOSIS — J309 Allergic rhinitis, unspecified: Secondary | ICD-10-CM

## 2019-08-19 ENCOUNTER — Ambulatory Visit (INDEPENDENT_AMBULATORY_CARE_PROVIDER_SITE_OTHER): Payer: BC Managed Care – PPO

## 2019-08-19 DIAGNOSIS — J309 Allergic rhinitis, unspecified: Secondary | ICD-10-CM | POA: Diagnosis not present

## 2019-09-02 ENCOUNTER — Ambulatory Visit (INDEPENDENT_AMBULATORY_CARE_PROVIDER_SITE_OTHER): Payer: BC Managed Care – PPO

## 2019-09-02 DIAGNOSIS — J309 Allergic rhinitis, unspecified: Secondary | ICD-10-CM

## 2019-09-16 ENCOUNTER — Ambulatory Visit (INDEPENDENT_AMBULATORY_CARE_PROVIDER_SITE_OTHER): Payer: BC Managed Care – PPO

## 2019-09-16 DIAGNOSIS — J309 Allergic rhinitis, unspecified: Secondary | ICD-10-CM | POA: Diagnosis not present

## 2019-09-30 ENCOUNTER — Ambulatory Visit (INDEPENDENT_AMBULATORY_CARE_PROVIDER_SITE_OTHER): Payer: BC Managed Care – PPO

## 2019-09-30 DIAGNOSIS — J309 Allergic rhinitis, unspecified: Secondary | ICD-10-CM

## 2019-10-14 ENCOUNTER — Ambulatory Visit (INDEPENDENT_AMBULATORY_CARE_PROVIDER_SITE_OTHER): Payer: BC Managed Care – PPO

## 2019-10-14 DIAGNOSIS — J309 Allergic rhinitis, unspecified: Secondary | ICD-10-CM | POA: Diagnosis not present

## 2019-10-28 ENCOUNTER — Ambulatory Visit (INDEPENDENT_AMBULATORY_CARE_PROVIDER_SITE_OTHER): Payer: BC Managed Care – PPO

## 2019-10-28 DIAGNOSIS — J309 Allergic rhinitis, unspecified: Secondary | ICD-10-CM | POA: Diagnosis not present

## 2019-11-11 ENCOUNTER — Ambulatory Visit (INDEPENDENT_AMBULATORY_CARE_PROVIDER_SITE_OTHER): Payer: BC Managed Care – PPO

## 2019-11-11 DIAGNOSIS — J309 Allergic rhinitis, unspecified: Secondary | ICD-10-CM

## 2019-11-23 ENCOUNTER — Ambulatory Visit (INDEPENDENT_AMBULATORY_CARE_PROVIDER_SITE_OTHER): Payer: BC Managed Care – PPO

## 2019-11-23 DIAGNOSIS — J309 Allergic rhinitis, unspecified: Secondary | ICD-10-CM | POA: Diagnosis not present

## 2019-12-07 ENCOUNTER — Ambulatory Visit (INDEPENDENT_AMBULATORY_CARE_PROVIDER_SITE_OTHER): Payer: BC Managed Care – PPO

## 2019-12-07 DIAGNOSIS — J309 Allergic rhinitis, unspecified: Secondary | ICD-10-CM | POA: Diagnosis not present

## 2019-12-21 ENCOUNTER — Ambulatory Visit (INDEPENDENT_AMBULATORY_CARE_PROVIDER_SITE_OTHER): Payer: BC Managed Care – PPO

## 2019-12-21 DIAGNOSIS — J309 Allergic rhinitis, unspecified: Secondary | ICD-10-CM

## 2020-01-04 NOTE — Progress Notes (Signed)
VIAL EXP 01-04-21 

## 2020-01-07 DIAGNOSIS — J3089 Other allergic rhinitis: Secondary | ICD-10-CM

## 2020-01-11 ENCOUNTER — Ambulatory Visit (INDEPENDENT_AMBULATORY_CARE_PROVIDER_SITE_OTHER): Payer: BC Managed Care – PPO

## 2020-01-11 DIAGNOSIS — J309 Allergic rhinitis, unspecified: Secondary | ICD-10-CM

## 2020-01-18 ENCOUNTER — Ambulatory Visit (INDEPENDENT_AMBULATORY_CARE_PROVIDER_SITE_OTHER): Payer: BC Managed Care – PPO

## 2020-01-18 DIAGNOSIS — J309 Allergic rhinitis, unspecified: Secondary | ICD-10-CM | POA: Diagnosis not present

## 2020-02-03 ENCOUNTER — Ambulatory Visit (INDEPENDENT_AMBULATORY_CARE_PROVIDER_SITE_OTHER): Payer: BC Managed Care – PPO

## 2020-02-03 DIAGNOSIS — J309 Allergic rhinitis, unspecified: Secondary | ICD-10-CM | POA: Diagnosis not present

## 2020-02-03 NOTE — Progress Notes (Signed)
Sinus pressure, ear pain, sinus headaches, no color to drainage. Per Dr. Delorse Lek advised patient to do ipratropium 2 sprays bid and she was given NeilMed sinus rinse. She is seeing Dr. Dellis Anes next Tuesday. No fever, no body aches, no chills.

## 2020-02-08 ENCOUNTER — Other Ambulatory Visit: Payer: Self-pay

## 2020-02-08 ENCOUNTER — Ambulatory Visit (INDEPENDENT_AMBULATORY_CARE_PROVIDER_SITE_OTHER): Payer: BC Managed Care – PPO | Admitting: Allergy & Immunology

## 2020-02-08 ENCOUNTER — Encounter: Payer: Self-pay | Admitting: Allergy & Immunology

## 2020-02-08 VITALS — BP 90/56 | HR 62 | Temp 98.5°F | Resp 16 | Ht 60.5 in | Wt 130.2 lb

## 2020-02-08 DIAGNOSIS — R21 Rash and other nonspecific skin eruption: Secondary | ICD-10-CM | POA: Diagnosis not present

## 2020-02-08 DIAGNOSIS — J0100 Acute maxillary sinusitis, unspecified: Secondary | ICD-10-CM | POA: Diagnosis not present

## 2020-02-08 DIAGNOSIS — J3089 Other allergic rhinitis: Secondary | ICD-10-CM

## 2020-02-08 MED ORDER — EPINEPHRINE 0.3 MG/0.3ML IJ SOAJ: INTRAMUSCULAR | 1 refills | Status: AC

## 2020-02-08 NOTE — Patient Instructions (Addendum)
1. 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 - Continue with Zyrtec daily as needed.  2. Sinus pressure - Add on prednisone packet.  - Add on Xhance one spray per nostril daily (sample provided).  3. Rash  - Add on triamcinolone 0.1% ointment twice daily as needed. - Give an update when you come in for next shot.   4. Return in about 1 year (around 02/07/2021).     Please inform us of any Emergency Department visits, hospitalizations, or changes in symptoms. Call us before going to the ED for breathing or allergy symptoms since we might be able to fit you in for a sick visit. Feel free to contact us anytime with any questions, problems, or concerns.  It was a pleasure to see you again today!  Websites that have reliable patient information: 1. American Academy of Asthma, Allergy, and Immunology: www.aaaai.org 2. Food Allergy Research and Education (FARE): foodallergy.org 3. Mothers of Asthmatics: http://www.asthmacommunitynetwork.org 4. American College of Allergy, Asthma, and Immunology: www.acaai.org   COVID-19 Vaccine Information can be found at: [email or web address].[email or web address] For questions related to vaccine distribution or appointments, please email [email or web address] or call [phone].     "Like" [place] on Facebook and Instagram for our latest updates!        Make sure you are registered to vote! If you have moved or changed any of your contact information, you will need to get this updated before voting!  In some cases, you MAY be able to register to vote online: [email or web address]

## 2020-02-08 NOTE — Progress Notes (Signed)
FOLLOW UP  Date of Service/Encounter:  02/08/20   Assessment:   Perennial allergic rhinitis (dust mite) - on immunotherapy (maintenance reached in February 2017)  Rash - around right antecubital fossa  Sinus pressure - ongoing for 2 months despite antibiotic use  Plan/Recommendations:   1. 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 - Continue with Zyrtec daily as needed.  2. Sinus pressure - Add on prednisone packet.  - Add on Xhance one spray per nostril daily (sample provided).  3. Rash  - Add on triamcinolone 0.1% ointment twice daily as needed. - Give an update when you come in for next shot.   4. Return in about 1 year (around 02/07/2021).    Subjective:   Stacy Howard is a 29 y.o. female presenting today for follow up of  Chief Complaint  Patient presents with   Follow-up   Allergic Rhinitis     Stacy Howard has a history of the following: Patient Active Problem List   Diagnosis Date Noted   Perennial allergic rhinitis 01/30/2015   Allergic conjunctivitis 01/30/2015    History obtained from: chart review and patient.  Stacy Howard is a 29 y.o. female presenting for a follow up visit.  She has a history of perennial allergic rhinitis with sensitization to dust mite.  She is on immunotherapy for this and she has been on it since February 2017.  We recommended taking an extra Zyrtec on the day of her allergy shots she is able to tolerate the advances better.  We did send in a prescription for Auvi-Q.    Since the last visit, she has done well.  She remains on her allergy shots.  She is receiving them every 2 weeks.  She is frozen at 0.1 mL every 2 weeks.  It seems to be controlling her symptoms fairly well.  She does use an antihistamine every day.  She does report today that she has had sinus pressure over her bilateral maxillary sinuses and frontal sinuses for around 2 months.  She  was given a course of antibiotics for about a week or 2 with transient improvement.  She has not been put on systemic steroids at all.  She does have a nasal spray but does not use it consistently.  She denies any fever or loss of sense of smell.  She is fully vaccinated to COVID-19 with the Pfizer vaccine.  She also is concerned with a rash on her right antecubital fossa.  It is slightly raised and quite pruritic.  She does not have any topical prescription ointments to put on it.  She has never had anything similar to this.  On exam, it appears always urticarial and she and her parents.  Otherwise, there have been no changes to her past medical history, surgical history, family history, or social history.    Review of Systems  Constitutional: Negative.  Negative for chills, fever, malaise/fatigue and weight loss.  HENT: Positive for congestion and sinus pain. Negative for ear discharge and ear pain.   Eyes: Negative for pain, discharge and redness.  Respiratory: Negative for cough, sputum production, shortness of breath and wheezing.   Cardiovascular: Negative.  Negative for chest pain and palpitations.  Gastrointestinal: Negative for abdominal pain, constipation, diarrhea, heartburn, nausea and vomiting.  Skin: Positive for itching and rash.  Neurological: Negative for dizziness and headaches.  Endo/Heme/Allergies: Positive for environmental allergies. Does not bruise/bleed easily.  Objective:   Blood pressure (!) 90/56, pulse 62, temperature 98.5 F (36.9 C), temperature source Temporal, resp. rate 16, height 5' 0.5" (1.537 m), weight 130 lb 3.2 oz (59.1 kg), SpO2 99 %. Body mass index is 25.01 kg/m.   Physical Exam:  Physical Exam Constitutional:      Appearance: She is well-developed.  HENT:     Head: Normocephalic and atraumatic.     Right Ear: Tympanic membrane, ear canal and external ear normal.     Left Ear: Tympanic membrane and ear canal normal.     Nose: No  nasal deformity, septal deviation, mucosal edema or rhinorrhea.     Right Turbinates: Enlarged and swollen.     Left Turbinates: Enlarged and swollen.     Right Sinus: No maxillary sinus tenderness or frontal sinus tenderness.     Left Sinus: No maxillary sinus tenderness or frontal sinus tenderness.     Mouth/Throat:     Mouth: Mucous membranes are not pale and not dry.     Pharynx: Uvula midline.  Eyes:     General:        Right eye: No discharge.        Left eye: No discharge.     Conjunctiva/sclera: Conjunctivae normal.     Right eye: Right conjunctiva is not injected. No chemosis.    Left eye: Left conjunctiva is not injected. No chemosis.    Pupils: Pupils are equal, round, and reactive to light.  Cardiovascular:     Rate and Rhythm: Normal rate and regular rhythm.     Heart sounds: Normal heart sounds.  Pulmonary:     Effort: Pulmonary effort is normal. No tachypnea, accessory muscle usage or respiratory distress.     Breath sounds: Normal breath sounds. No wheezing, rhonchi or rales.     Comments: Moving air well in all lung fields. Chest:     Chest wall: No tenderness.  Lymphadenopathy:     Cervical: No cervical adenopathy.  Skin:    Coloration: Skin is not pale.     Findings: No abrasion, erythema, petechiae or rash. Rash is not papular, urticarial or vesicular.     Comments: She does have what appears to be urticarial lesions in the right antecubital fossa.  There is no honey crusting or discharge.  Neurological:     Mental Status: She is alert.      Diagnostic studies: none       Malachi Bonds, MD  [facility]

## 2020-02-09 ENCOUNTER — Encounter: Payer: Self-pay | Admitting: Allergy & Immunology

## 2020-02-09 MED ORDER — TRIAMCINOLONE ACETONIDE 0.1 % EX OINT: 1.0000 "application " | TOPICAL_OINTMENT | Freq: Two times a day (BID) | CUTANEOUS | 6 refills | Status: AC

## 2020-02-09 MED ORDER — CETIRIZINE HCL 10 MG PO TABS: 10.0000 mg | ORAL_TABLET | Freq: Every day | ORAL | 6 refills | Status: AC

## 2020-02-09 MED ORDER — FLUTICASONE PROPIONATE 50 MCG/ACT NA SUSP
1.0000 | Freq: Every day | NASAL | 6 refills | Status: AC
Start: 2020-02-09 — End: ?

## 2020-02-17 ENCOUNTER — Ambulatory Visit (INDEPENDENT_AMBULATORY_CARE_PROVIDER_SITE_OTHER): Payer: BC Managed Care – PPO

## 2020-02-17 DIAGNOSIS — J3089 Other allergic rhinitis: Secondary | ICD-10-CM | POA: Diagnosis not present

## 2020-03-02 ENCOUNTER — Ambulatory Visit (INDEPENDENT_AMBULATORY_CARE_PROVIDER_SITE_OTHER): Payer: BC Managed Care – PPO

## 2020-03-02 DIAGNOSIS — J309 Allergic rhinitis, unspecified: Secondary | ICD-10-CM | POA: Diagnosis not present

## 2020-03-14 ENCOUNTER — Ambulatory Visit (INDEPENDENT_AMBULATORY_CARE_PROVIDER_SITE_OTHER): Payer: BC Managed Care – PPO | Admitting: *Deleted

## 2020-03-14 DIAGNOSIS — J309 Allergic rhinitis, unspecified: Secondary | ICD-10-CM

## 2020-03-30 ENCOUNTER — Ambulatory Visit (INDEPENDENT_AMBULATORY_CARE_PROVIDER_SITE_OTHER): Payer: BC Managed Care – PPO

## 2020-03-30 DIAGNOSIS — J309 Allergic rhinitis, unspecified: Secondary | ICD-10-CM

## 2020-04-20 ENCOUNTER — Ambulatory Visit (INDEPENDENT_AMBULATORY_CARE_PROVIDER_SITE_OTHER): Payer: BC Managed Care – PPO

## 2020-04-20 DIAGNOSIS — J309 Allergic rhinitis, unspecified: Secondary | ICD-10-CM | POA: Diagnosis not present

## 2020-05-02 ENCOUNTER — Ambulatory Visit (INDEPENDENT_AMBULATORY_CARE_PROVIDER_SITE_OTHER): Payer: BC Managed Care – PPO

## 2020-05-02 DIAGNOSIS — J309 Allergic rhinitis, unspecified: Secondary | ICD-10-CM

## 2020-05-25 ENCOUNTER — Ambulatory Visit (INDEPENDENT_AMBULATORY_CARE_PROVIDER_SITE_OTHER): Payer: BC Managed Care – PPO

## 2020-05-25 DIAGNOSIS — J309 Allergic rhinitis, unspecified: Secondary | ICD-10-CM

## 2020-06-08 ENCOUNTER — Ambulatory Visit (INDEPENDENT_AMBULATORY_CARE_PROVIDER_SITE_OTHER): Payer: BC Managed Care – PPO | Admitting: *Deleted

## 2020-06-08 DIAGNOSIS — J309 Allergic rhinitis, unspecified: Secondary | ICD-10-CM | POA: Diagnosis not present

## 2020-06-22 ENCOUNTER — Ambulatory Visit (INDEPENDENT_AMBULATORY_CARE_PROVIDER_SITE_OTHER): Payer: BC Managed Care – PPO | Admitting: *Deleted

## 2020-06-22 DIAGNOSIS — J309 Allergic rhinitis, unspecified: Secondary | ICD-10-CM

## 2020-07-06 ENCOUNTER — Ambulatory Visit (INDEPENDENT_AMBULATORY_CARE_PROVIDER_SITE_OTHER): Payer: BC Managed Care – PPO

## 2020-07-06 DIAGNOSIS — J309 Allergic rhinitis, unspecified: Secondary | ICD-10-CM

## 2020-07-20 ENCOUNTER — Ambulatory Visit (INDEPENDENT_AMBULATORY_CARE_PROVIDER_SITE_OTHER): Payer: BC Managed Care – PPO

## 2020-07-20 DIAGNOSIS — J309 Allergic rhinitis, unspecified: Secondary | ICD-10-CM

## 2020-08-03 ENCOUNTER — Ambulatory Visit (INDEPENDENT_AMBULATORY_CARE_PROVIDER_SITE_OTHER): Payer: BC Managed Care – PPO

## 2020-08-03 DIAGNOSIS — J309 Allergic rhinitis, unspecified: Secondary | ICD-10-CM | POA: Diagnosis not present

## 2020-08-17 ENCOUNTER — Ambulatory Visit (INDEPENDENT_AMBULATORY_CARE_PROVIDER_SITE_OTHER): Payer: BC Managed Care – PPO

## 2020-08-17 DIAGNOSIS — J309 Allergic rhinitis, unspecified: Secondary | ICD-10-CM | POA: Diagnosis not present

## 2020-08-29 ENCOUNTER — Ambulatory Visit (INDEPENDENT_AMBULATORY_CARE_PROVIDER_SITE_OTHER): Payer: BC Managed Care – PPO | Admitting: *Deleted

## 2020-08-29 DIAGNOSIS — J309 Allergic rhinitis, unspecified: Secondary | ICD-10-CM

## 2020-09-19 ENCOUNTER — Ambulatory Visit (INDEPENDENT_AMBULATORY_CARE_PROVIDER_SITE_OTHER): Payer: BC Managed Care – PPO | Admitting: *Deleted

## 2020-09-19 DIAGNOSIS — J309 Allergic rhinitis, unspecified: Secondary | ICD-10-CM

## 2020-10-05 ENCOUNTER — Ambulatory Visit (INDEPENDENT_AMBULATORY_CARE_PROVIDER_SITE_OTHER): Payer: BC Managed Care – PPO

## 2020-10-05 DIAGNOSIS — J309 Allergic rhinitis, unspecified: Secondary | ICD-10-CM | POA: Diagnosis not present

## 2020-10-24 ENCOUNTER — Ambulatory Visit (INDEPENDENT_AMBULATORY_CARE_PROVIDER_SITE_OTHER): Payer: BC Managed Care – PPO | Admitting: *Deleted

## 2020-10-24 DIAGNOSIS — J309 Allergic rhinitis, unspecified: Secondary | ICD-10-CM

## 2020-11-07 ENCOUNTER — Ambulatory Visit (INDEPENDENT_AMBULATORY_CARE_PROVIDER_SITE_OTHER): Payer: BC Managed Care – PPO | Admitting: *Deleted

## 2020-11-07 DIAGNOSIS — J309 Allergic rhinitis, unspecified: Secondary | ICD-10-CM | POA: Diagnosis not present

## 2020-11-28 ENCOUNTER — Ambulatory Visit (INDEPENDENT_AMBULATORY_CARE_PROVIDER_SITE_OTHER): Payer: BC Managed Care – PPO | Admitting: *Deleted

## 2020-11-28 DIAGNOSIS — J309 Allergic rhinitis, unspecified: Secondary | ICD-10-CM | POA: Diagnosis not present

## 2020-11-30 DIAGNOSIS — J3089 Other allergic rhinitis: Secondary | ICD-10-CM | POA: Diagnosis not present

## 2020-11-30 NOTE — Progress Notes (Signed)
VIALS MADE. EXP 11-30-21 

## 2020-12-06 ENCOUNTER — Telehealth: Payer: BC Managed Care – PPO

## 2020-12-06 NOTE — Telephone Encounter (Signed)
That is fine with me.  Stacy Edmonston, MD [facility]  

## 2020-12-06 NOTE — Telephone Encounter (Signed)
Stacy Howard called today wanting to know if she can come in this week to get her allergy injection, because she is going out of the [place] next week for two weeks and she's concerned about her allergies while she's out of the country.  She is frozen on 0.1 red every other week Her last injection was 11/28/20 @ .15   Please advise Dr. Dellis Anes

## 2020-12-07 ENCOUNTER — Ambulatory Visit (INDEPENDENT_AMBULATORY_CARE_PROVIDER_SITE_OTHER): Payer: BC Managed Care – PPO

## 2020-12-07 DIAGNOSIS — J309 Allergic rhinitis, unspecified: Secondary | ICD-10-CM

## 2020-12-07 NOTE — Telephone Encounter (Signed)
Lm for pt explaining she can get the shot this week but we are closed tomorrow

## 2020-12-28 ENCOUNTER — Ambulatory Visit (INDEPENDENT_AMBULATORY_CARE_PROVIDER_SITE_OTHER): Payer: BC Managed Care – PPO | Admitting: *Deleted

## 2020-12-28 DIAGNOSIS — J309 Allergic rhinitis, unspecified: Secondary | ICD-10-CM | POA: Diagnosis not present

## 2021-01-16 ENCOUNTER — Ambulatory Visit (INDEPENDENT_AMBULATORY_CARE_PROVIDER_SITE_OTHER): Payer: BC Managed Care – PPO | Admitting: *Deleted

## 2021-01-16 DIAGNOSIS — J309 Allergic rhinitis, unspecified: Secondary | ICD-10-CM | POA: Diagnosis not present

## 2021-01-30 ENCOUNTER — Ambulatory Visit (INDEPENDENT_AMBULATORY_CARE_PROVIDER_SITE_OTHER): Payer: BC Managed Care – PPO | Admitting: *Deleted

## 2021-01-30 DIAGNOSIS — J309 Allergic rhinitis, unspecified: Secondary | ICD-10-CM

## 2021-02-06 ENCOUNTER — Ambulatory Visit: Payer: No Typology Code available for payment source | Admitting: Allergy & Immunology

## 2021-02-27 ENCOUNTER — Ambulatory Visit (INDEPENDENT_AMBULATORY_CARE_PROVIDER_SITE_OTHER): Payer: BC Managed Care – PPO | Admitting: *Deleted

## 2021-02-27 DIAGNOSIS — J309 Allergic rhinitis, unspecified: Secondary | ICD-10-CM | POA: Diagnosis not present

## 2021-03-13 ENCOUNTER — Ambulatory Visit: Payer: BC Managed Care – PPO | Admitting: Allergy & Immunology

## 2021-03-13 ENCOUNTER — Ambulatory Visit: Payer: Self-pay | Admitting: *Deleted

## 2021-03-13 ENCOUNTER — Encounter: Payer: Self-pay | Admitting: Allergy & Immunology

## 2021-03-13 ENCOUNTER — Other Ambulatory Visit: Payer: Self-pay

## 2021-03-13 VITALS — BP 114/78 | HR 80 | Temp 98.0°F | Resp 18 | Ht 60.0 in | Wt 132.2 lb

## 2021-03-13 DIAGNOSIS — J0100 Acute maxillary sinusitis, unspecified: Secondary | ICD-10-CM | POA: Diagnosis not present

## 2021-03-13 DIAGNOSIS — J309 Allergic rhinitis, unspecified: Secondary | ICD-10-CM

## 2021-03-13 DIAGNOSIS — J3089 Other allergic rhinitis: Secondary | ICD-10-CM

## 2021-03-13 NOTE — Progress Notes (Signed)
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 - Continue with Zyrtec daily.  - It seems that you might be someone who needs to remain on allergy shots for a longer period of time. - Typically we continue only for 5 years max, but not everyone fits the recommendations. - We can certainly continue for longer than the recommended 5 years.   2. Rash  - Continue with triamcinolone 0.1% ointment twice daily as needed (NOT ON THE FACE). - Continue with triamcinolone 0.025% twice daily as needed for your eye lids sparingly.  3. Return in about 1 year (around 03/13/2022).      Subjective:   Stacy Howard is a 31 y.o. female presenting today for follow up of  Chief Complaint  Patient presents with   Allergic Rhinitis     Stacy Howard was having issues with her sinuses - no issues since then    Angioedema    No flares     Stacy Howard has a history of the following: Patient Active Problem List   Diagnosis Date Noted   Perennial allergic rhinitis 01/30/2015   Allergic conjunctivitis 01/30/2015    History obtained from: chart review and patient.  Stacy Howard is a 30 y.o. female presenting for a follow up visit.  She was last seen in August 2021.  At that time, we continue with allergy shots at the same schedule.  We also continue with Zyrtec daily as needed.  For sinus pressure, we added on prednisone and added Xhance 1 spray per nostril daily. She had a rash that we treated with triamcinolone twice daily.  Since last visit, she has done well.   Allergic Rhinitis Symptom History: She has been using cetirizine every day. She does not use a nose spray on a routine basis. She has not needed  antibiotics or prednisone since the last visit at all.   She did have an episode of worsening symptoms in Stacy Howard when she skipped a month. She had a reaction and felt that her symptoms returned. She reports that she had sinus pressure and she just feels that there is "dust scattering in [her] nose". She also reports that her throat was tightening. As long as she takes her shots, this does not happen. When she does have these episodes, it takes around 3 days to return to normal.   Stacy Howard is on allergen immunotherapy. She receives one injection. Immunotherapy script #1 contains dust mites. She currently receives 0.49mL of the RED vial (1/100). She reached maintenance in February 2017. She started shots sometime in 2016 and has mostly tolerated these well. She is doing well with regards to tolerating them. She will have a tiny hive occasionally and it does not last long at all.   She had previously been on shots when she was a teenager.   She had a reaction to Bactrim recently resulting in muscle pain. She did not have any anaphylactic symptoms.   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   Skin Symptom History: She does have a periodic rash on her  wrist and her hands (extensor surfaces).   Otherwise, there have been no changes to her past medical history, surgical history, family history, or social history.    Review of Systems  Constitutional: Negative.  Negative for fever, malaise/fatigue and weight loss.  HENT: Negative.  Negative for congestion, ear discharge and ear pain.   Eyes:  Negative for pain, discharge and redness.  Respiratory:  Negative for cough, sputum production, shortness of breath and wheezing.   Cardiovascular: Negative.  Negative for chest pain and palpitations.  Gastrointestinal:  Negative for abdominal pain and heartburn.  Skin: Negative.  Negative  for itching and rash.  Neurological:  Negative for dizziness and headaches.  Endo/Heme/Allergies:  Negative for environmental allergies. Does not bruise/bleed easily.      Objective:   Blood pressure 114/78, pulse 80, temperature 98 F (36.7 C), resp. rate 18, height 5' (1.524 m), weight 132 lb 3.2 oz (60 kg), SpO2 98 %. Body mass index is 25.82 kg/m.   Physical Exam:  Physical Exam Vitals reviewed.  Constitutional:      Appearance: She is well-developed.  HENT:     Head: Normocephalic and atraumatic.     Comments: Very pleasant and kind.     Right Ear: Tympanic membrane, ear canal and external ear normal.     Left Ear: Tympanic membrane, ear canal and external ear normal.     Nose: No nasal deformity, septal deviation, mucosal edema or rhinorrhea.     Right Turbinates: Enlarged and swollen.     Left Turbinates: Enlarged and swollen.     Right Sinus: No maxillary sinus tenderness or frontal sinus tenderness.     Left Sinus: No maxillary sinus tenderness or frontal sinus tenderness.     Mouth/Throat:     Mouth: Mucous membranes are not pale and not dry.     Pharynx: Uvula midline.  Eyes:     General: Lids are normal. No allergic shiner.       Right eye: No discharge.        Left eye: No discharge.     Conjunctiva/sclera: Conjunctivae normal.     Right eye: Right conjunctiva is not injected. No chemosis.    Left eye: Left conjunctiva is not injected. No chemosis.    Pupils: Pupils are equal, round, and reactive to light.  Cardiovascular:     Rate and Rhythm: Normal rate and regular rhythm.     Heart sounds: Normal heart sounds.  Pulmonary:     Effort: Pulmonary effort is normal. No tachypnea, accessory muscle usage or respiratory distress.     Breath sounds: Normal breath sounds. No wheezing, rhonchi or rales.  Chest:     Chest wall: No tenderness.  Lymphadenopathy:     Cervical: No cervical adenopathy.  Skin:    Coloration: Skin is not pale.     Findings: No  abrasion, erythema, petechiae or rash. Rash is not papular, urticarial or vesicular.  Neurological:     Mental Status: She is alert.  Psychiatric:        Behavior: Behavior is cooperative.     Diagnostic studies: none     Malachi Bonds, MD  [facility]

## 2021-03-13 NOTE — Patient Instructions (Addendum)
1. 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 - Continue with Zyrtec daily.  - It seems that you might be someone who needs to remain on allergy shots for a longer period of time. - Typically we continue only for 5 years max, but not everyone fits the recommendations. - We can certainly continue for longer than the recommended 5 years.   2. Rash  - Continue with triamcinolone 0.1% ointment twice daily as needed (NOT ON THE FACE). - Continue with triamcinolone 0.025% twice daily as needed for your eye lids sparingly.  3. Return in about 1 year (around 03/13/2022).     Please inform us of any Emergency Department visits, hospitalizations, or changes in symptoms. Call us before going to the ED for breathing or allergy symptoms since we might be able to fit you in for a sick visit. Feel free to contact us anytime with any questions, problems, or concerns.  It was a pleasure to see you again today!  Websites that have reliable patient information: 1. American Academy of Asthma, Allergy, and Immunology: www.aaaai.org 2. Food Allergy Research and Education (FARE): foodallergy.org 3. Mothers of Asthmatics: http://www.asthmacommunitynetwork.org 4. American College of Allergy, Asthma, and Immunology: www.acaai.org   COVID-19 Vaccine Information can be found at: PodExchange.nl For questions related to vaccine distribution or appointments, please email [email or web address] or call [phone].     "Like" [place] on Facebook and Instagram for our latest updates!        Make sure you are registered to vote! If you have moved or changed any of your contact information, you will need to get this updated before voting!  In some cases, you MAY be able to register to vote online: [email or web address]

## 2021-03-29 ENCOUNTER — Ambulatory Visit (INDEPENDENT_AMBULATORY_CARE_PROVIDER_SITE_OTHER): Payer: BC Managed Care – PPO | Admitting: *Deleted

## 2021-03-29 DIAGNOSIS — J309 Allergic rhinitis, unspecified: Secondary | ICD-10-CM | POA: Diagnosis not present

## 2021-04-17 ENCOUNTER — Ambulatory Visit (INDEPENDENT_AMBULATORY_CARE_PROVIDER_SITE_OTHER): Payer: BC Managed Care – PPO | Admitting: *Deleted

## 2021-04-17 DIAGNOSIS — J309 Allergic rhinitis, unspecified: Secondary | ICD-10-CM

## 2021-05-01 ENCOUNTER — Ambulatory Visit (INDEPENDENT_AMBULATORY_CARE_PROVIDER_SITE_OTHER): Payer: BC Managed Care – PPO

## 2021-05-01 DIAGNOSIS — J309 Allergic rhinitis, unspecified: Secondary | ICD-10-CM

## 2021-05-10 ENCOUNTER — Ambulatory Visit (INDEPENDENT_AMBULATORY_CARE_PROVIDER_SITE_OTHER): Payer: BC Managed Care – PPO | Admitting: *Deleted

## 2021-05-10 DIAGNOSIS — J309 Allergic rhinitis, unspecified: Secondary | ICD-10-CM

## 2021-05-24 ENCOUNTER — Ambulatory Visit (INDEPENDENT_AMBULATORY_CARE_PROVIDER_SITE_OTHER): Payer: BC Managed Care – PPO

## 2021-05-24 DIAGNOSIS — J309 Allergic rhinitis, unspecified: Secondary | ICD-10-CM | POA: Diagnosis not present

## 2021-06-07 ENCOUNTER — Ambulatory Visit (INDEPENDENT_AMBULATORY_CARE_PROVIDER_SITE_OTHER): Payer: BC Managed Care – PPO | Admitting: *Deleted

## 2021-06-07 DIAGNOSIS — J309 Allergic rhinitis, unspecified: Secondary | ICD-10-CM | POA: Diagnosis not present

## 2021-06-21 ENCOUNTER — Ambulatory Visit (INDEPENDENT_AMBULATORY_CARE_PROVIDER_SITE_OTHER): Payer: BC Managed Care – PPO

## 2021-06-21 DIAGNOSIS — J309 Allergic rhinitis, unspecified: Secondary | ICD-10-CM | POA: Diagnosis not present

## 2021-07-05 ENCOUNTER — Ambulatory Visit (INDEPENDENT_AMBULATORY_CARE_PROVIDER_SITE_OTHER): Payer: BC Managed Care – PPO

## 2021-07-05 DIAGNOSIS — J309 Allergic rhinitis, unspecified: Secondary | ICD-10-CM

## 2021-07-19 ENCOUNTER — Ambulatory Visit (INDEPENDENT_AMBULATORY_CARE_PROVIDER_SITE_OTHER): Payer: BC Managed Care – PPO

## 2021-07-19 DIAGNOSIS — J309 Allergic rhinitis, unspecified: Secondary | ICD-10-CM

## 2021-08-02 ENCOUNTER — Ambulatory Visit (INDEPENDENT_AMBULATORY_CARE_PROVIDER_SITE_OTHER): Payer: BC Managed Care – PPO | Admitting: *Deleted

## 2021-08-02 DIAGNOSIS — J309 Allergic rhinitis, unspecified: Secondary | ICD-10-CM

## 2021-08-16 ENCOUNTER — Ambulatory Visit (INDEPENDENT_AMBULATORY_CARE_PROVIDER_SITE_OTHER): Payer: BC Managed Care – PPO

## 2021-08-16 DIAGNOSIS — J309 Allergic rhinitis, unspecified: Secondary | ICD-10-CM

## 2021-08-28 ENCOUNTER — Ambulatory Visit (INDEPENDENT_AMBULATORY_CARE_PROVIDER_SITE_OTHER): Payer: BC Managed Care – PPO

## 2021-08-28 DIAGNOSIS — J309 Allergic rhinitis, unspecified: Secondary | ICD-10-CM | POA: Diagnosis not present

## 2021-09-13 ENCOUNTER — Ambulatory Visit (INDEPENDENT_AMBULATORY_CARE_PROVIDER_SITE_OTHER): Payer: BC Managed Care – PPO | Admitting: *Deleted

## 2021-09-13 DIAGNOSIS — J309 Allergic rhinitis, unspecified: Secondary | ICD-10-CM | POA: Diagnosis not present

## 2021-09-25 ENCOUNTER — Ambulatory Visit (INDEPENDENT_AMBULATORY_CARE_PROVIDER_SITE_OTHER): Payer: BC Managed Care – PPO

## 2021-09-25 DIAGNOSIS — J309 Allergic rhinitis, unspecified: Secondary | ICD-10-CM | POA: Diagnosis not present

## 2021-10-17 DIAGNOSIS — J3089 Other allergic rhinitis: Secondary | ICD-10-CM | POA: Diagnosis not present

## 2021-10-17 NOTE — Progress Notes (Signed)
VIAL EXP 10-18-22 ?

## 2021-10-23 ENCOUNTER — Ambulatory Visit (INDEPENDENT_AMBULATORY_CARE_PROVIDER_SITE_OTHER): Payer: 59

## 2021-10-23 DIAGNOSIS — J309 Allergic rhinitis, unspecified: Secondary | ICD-10-CM

## 2021-11-08 ENCOUNTER — Ambulatory Visit (INDEPENDENT_AMBULATORY_CARE_PROVIDER_SITE_OTHER): Payer: 59

## 2021-11-08 DIAGNOSIS — J309 Allergic rhinitis, unspecified: Secondary | ICD-10-CM

## 2021-11-20 ENCOUNTER — Ambulatory Visit (INDEPENDENT_AMBULATORY_CARE_PROVIDER_SITE_OTHER): Payer: 59

## 2021-11-20 DIAGNOSIS — J309 Allergic rhinitis, unspecified: Secondary | ICD-10-CM

## 2021-12-04 ENCOUNTER — Ambulatory Visit (INDEPENDENT_AMBULATORY_CARE_PROVIDER_SITE_OTHER): Payer: 59

## 2021-12-04 DIAGNOSIS — J309 Allergic rhinitis, unspecified: Secondary | ICD-10-CM | POA: Diagnosis not present

## 2021-12-20 ENCOUNTER — Ambulatory Visit (INDEPENDENT_AMBULATORY_CARE_PROVIDER_SITE_OTHER): Payer: 59

## 2021-12-20 DIAGNOSIS — J309 Allergic rhinitis, unspecified: Secondary | ICD-10-CM

## 2022-01-03 ENCOUNTER — Ambulatory Visit (INDEPENDENT_AMBULATORY_CARE_PROVIDER_SITE_OTHER): Payer: 59

## 2022-01-03 DIAGNOSIS — J309 Allergic rhinitis, unspecified: Secondary | ICD-10-CM | POA: Diagnosis not present

## 2022-01-15 ENCOUNTER — Ambulatory Visit (INDEPENDENT_AMBULATORY_CARE_PROVIDER_SITE_OTHER): Payer: 59 | Admitting: *Deleted

## 2022-01-15 DIAGNOSIS — J309 Allergic rhinitis, unspecified: Secondary | ICD-10-CM

## 2022-01-29 ENCOUNTER — Ambulatory Visit (INDEPENDENT_AMBULATORY_CARE_PROVIDER_SITE_OTHER): Payer: 59 | Admitting: *Deleted

## 2022-01-29 DIAGNOSIS — J309 Allergic rhinitis, unspecified: Secondary | ICD-10-CM | POA: Diagnosis not present

## 2022-02-21 ENCOUNTER — Ambulatory Visit (INDEPENDENT_AMBULATORY_CARE_PROVIDER_SITE_OTHER): Payer: 59 | Admitting: *Deleted

## 2022-02-21 DIAGNOSIS — J309 Allergic rhinitis, unspecified: Secondary | ICD-10-CM
